# Patient Record
Sex: Female | Born: 1985 | Race: Black or African American | Hispanic: No | Marital: Single | Smoking: Never smoker
Health system: Southern US, Community
[De-identification: ages and names within clinical notes are randomized; demographics above are authoritative.]

## PROBLEM LIST (undated history)

## (undated) HISTORY — PX: DILATION AND CURETTAGE OF UTERUS: SHX78

---

## 2004-07-17 ENCOUNTER — Ambulatory Visit: Payer: Self-pay | Admitting: Family Medicine

## 2004-08-20 ENCOUNTER — Ambulatory Visit: Payer: Self-pay | Admitting: Family Medicine

## 2005-11-17 IMAGING — US US PELV - US TRANSVAGINAL
1 series · 17 of 25 positions shown · non-contrast
Comparison: none

REASON FOR EXAM: Pelvic pain
COMMENTS:

[Series 1: us pelv - us transvaginal · 17 of 38 slices shown]
[im 1/38]
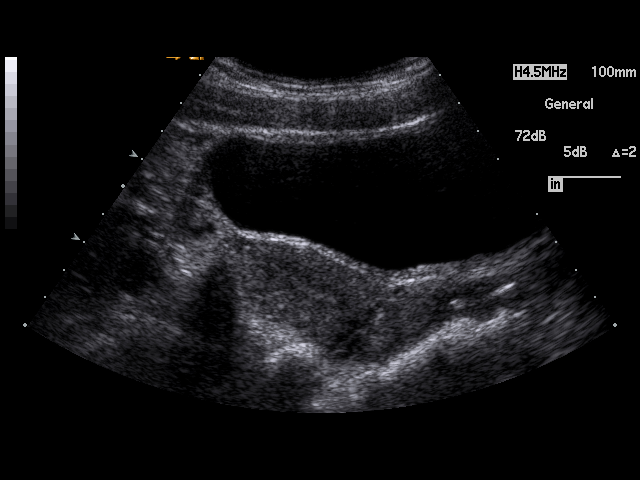
[im 4/38]
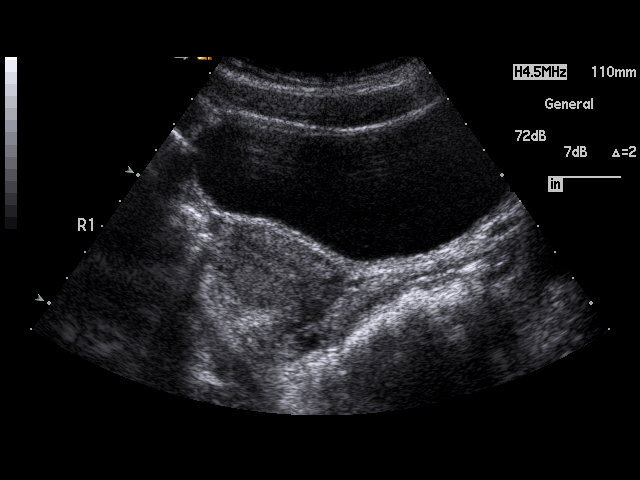
[im 5/38]
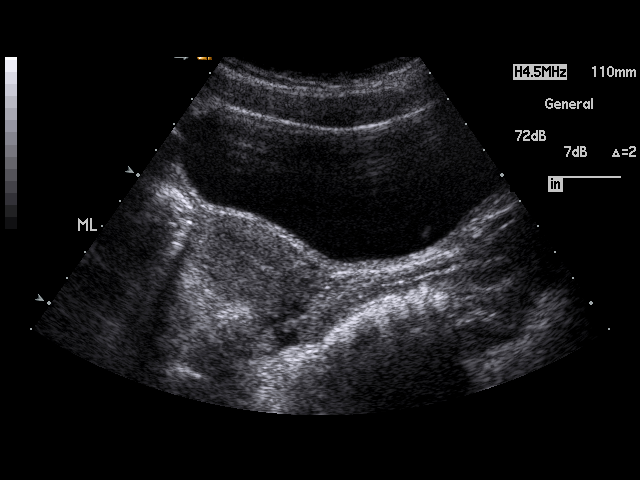
[im 8/38]
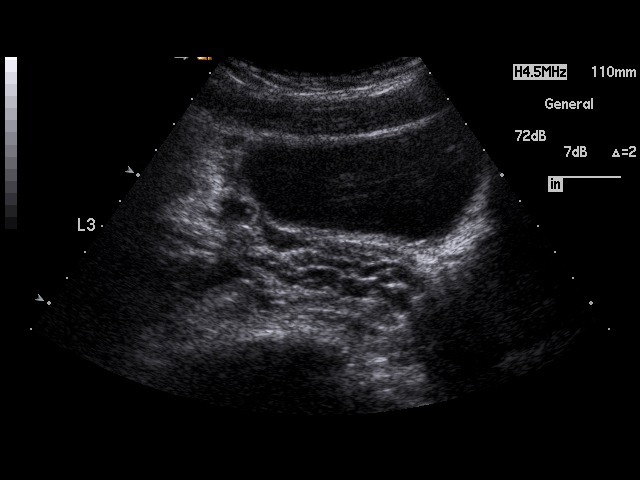
[im 10/38]
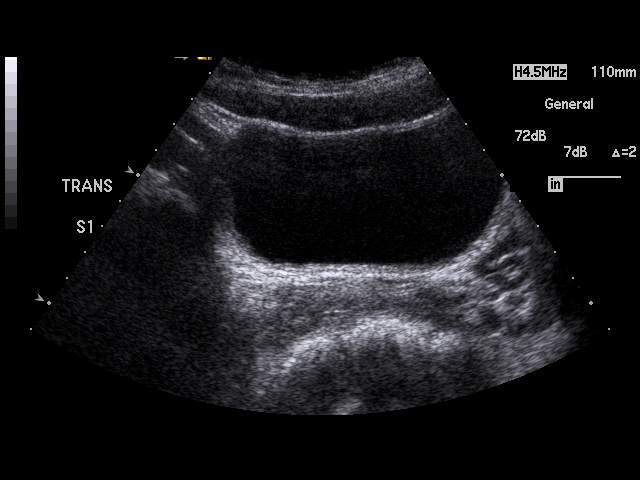
[im 13/38]
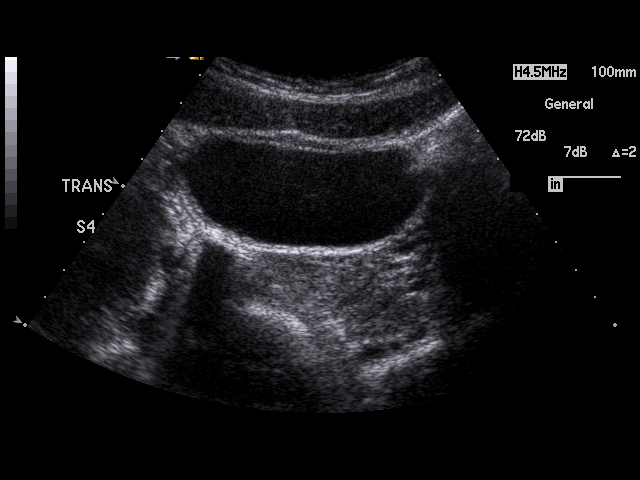
[im 14/38]
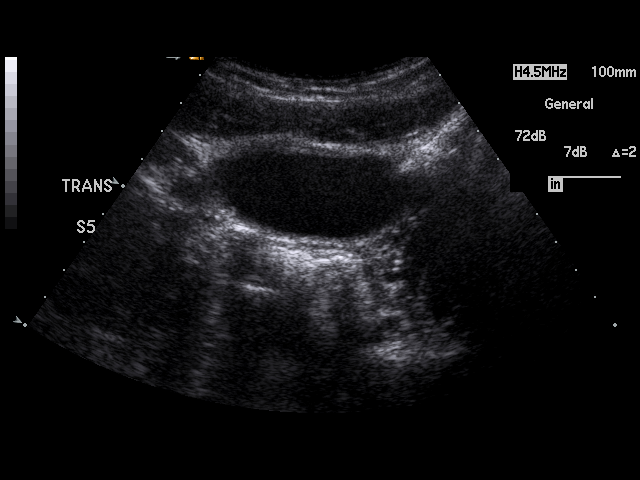
[im 17/38]
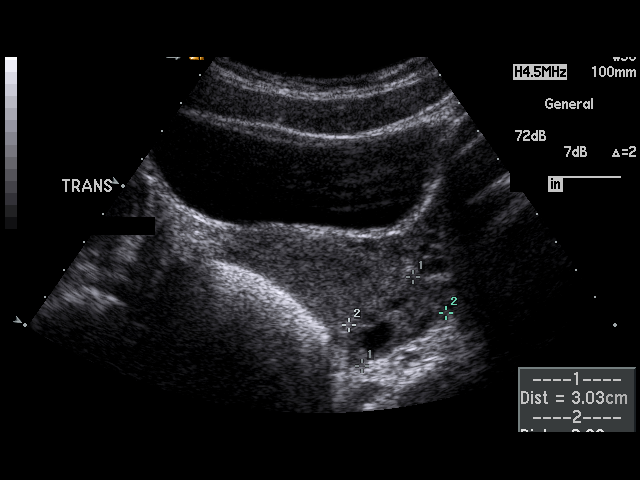
[im 19/38]
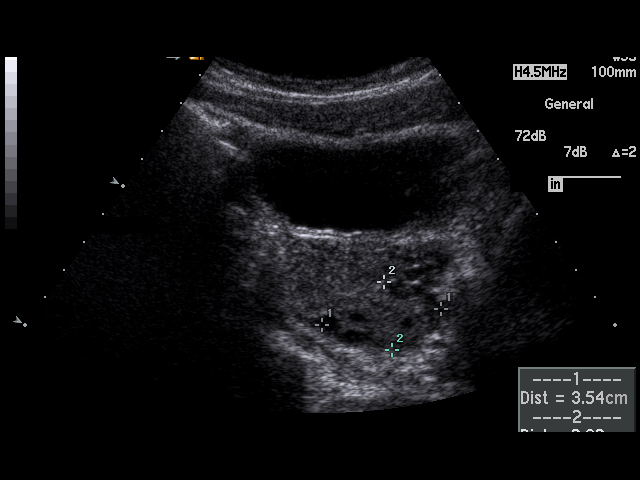
[im 21/38]
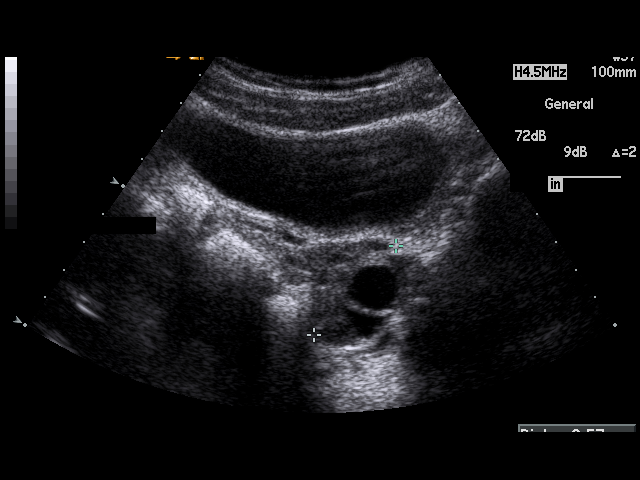
[im 24/38]
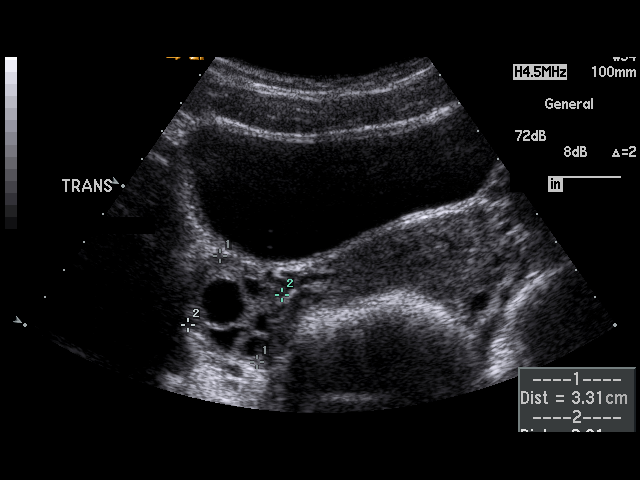
[im 25/38]
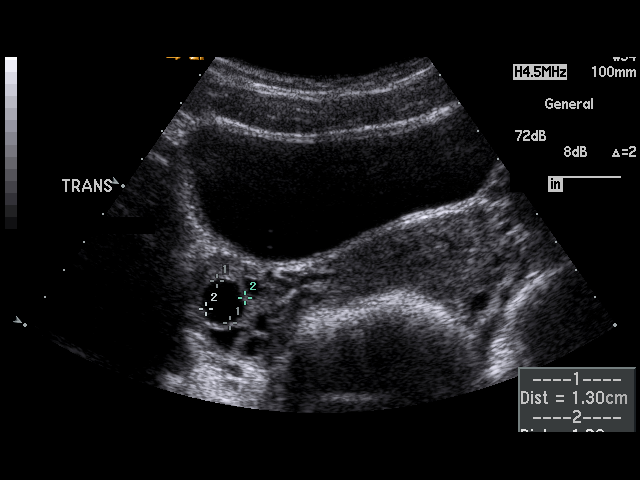
[im 28/38]
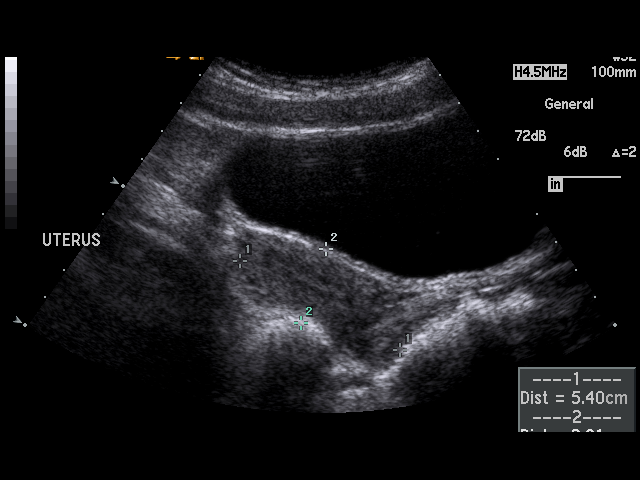
[im 30/38]
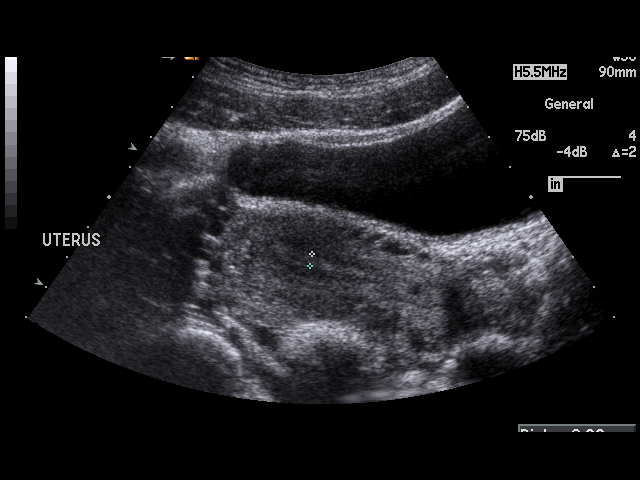
[im 33/38]
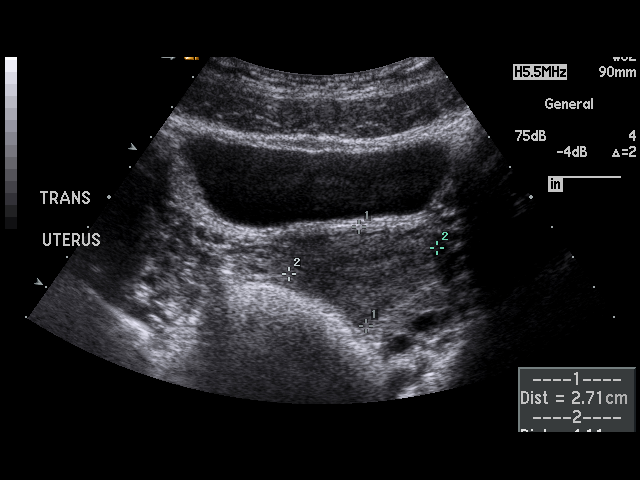
[im 34/38]
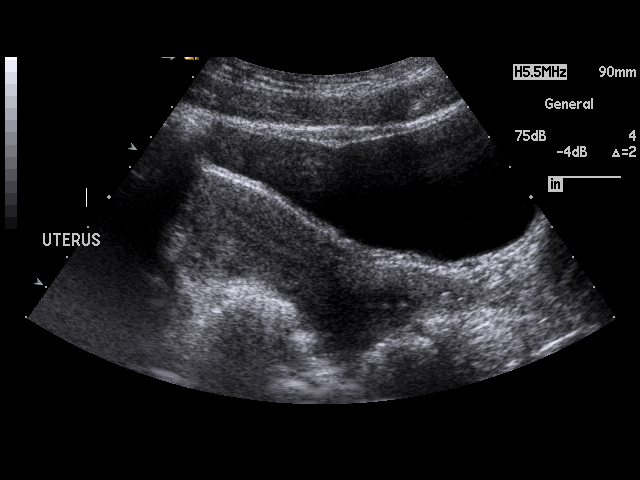
[im 38/38]
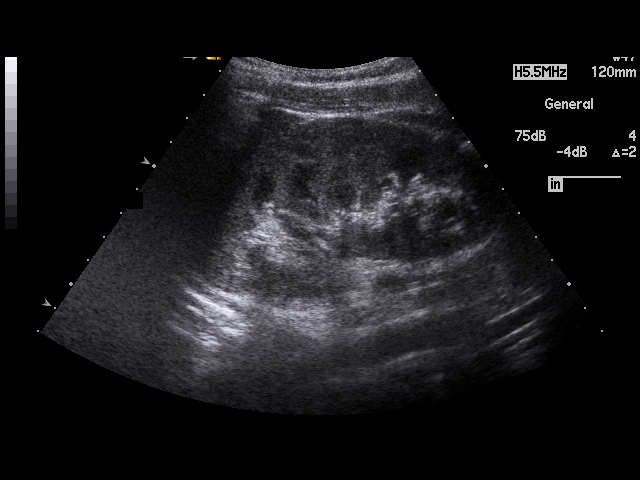

[17 of 25 positions shown; findings below may reference images not displayed]

PROCEDURE:     US  - US PELVIS MASS EXAM  - [DATE] [DATE] [DATE]  [DATE]

RESULT:     The patient is complaining of pelvic discomfort.  The uterus
exhibits normal echotexture with no evidence of abnormal endometrial
thickening.  The uterus measures 5.4 x 2.7 x 4.1 cm.  The endometrial stripe
measures 3.3 mm.  No abnormal endometrial fluid collections are seen.  I see
no free fluid in the cul-de-sac.  The RIGHT ovary measures 3.6 x 3.3 x
cm and contains an approximately 1.4 cm diameter simple-appearing cyst as
well as developing follicles.  The LEFT ovary exhibits a few developing
follicles and measures 3.5 x 2.0 x 2.9 cm.  I see no free fluid in the
cul-de-sac.
IMPRESSION: The appearance of the uterus and LEFT ovary is normal.  The RIGHT ovary
demonstrates an approximately 1.4 cm diameter simple-appearing cyst.

## 2005-12-21 IMAGING — US US PELV - US TRANSVAGINAL
1 series · 17 of 25 positions shown · non-contrast
Comparison: none

REASON FOR EXAM: F/U ovarian cyst
COMMENTS:

[Series 1: us pelv - us transvaginal · 17 of 51 slices shown]
[im 1/51]
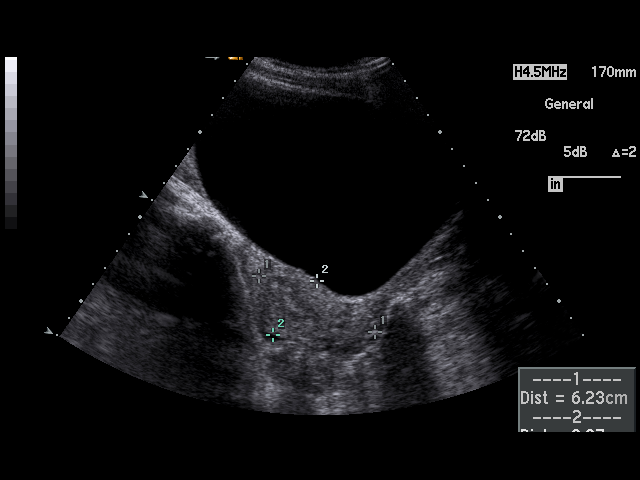
[im 5/51]
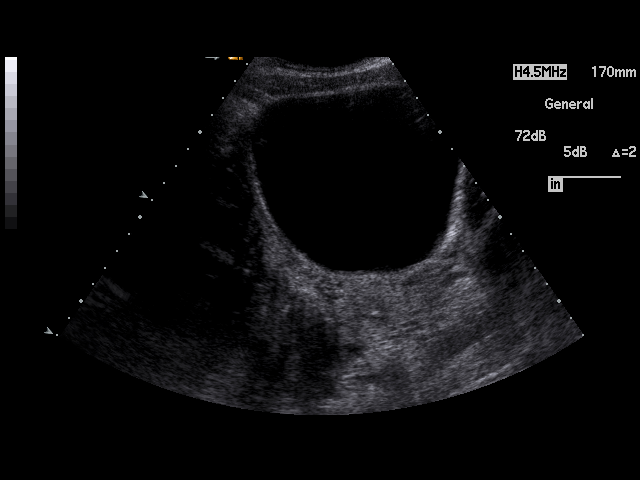
[im 7/51]
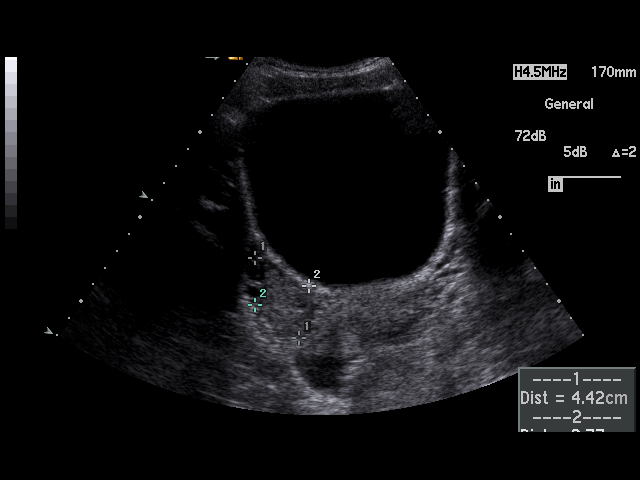
[im 11/51]
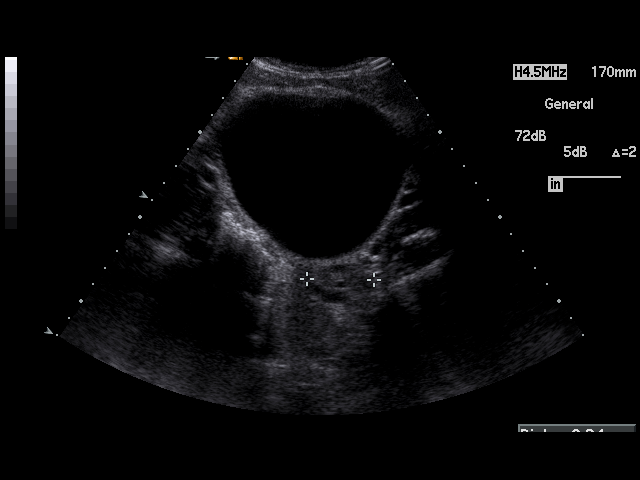
[im 13/51]
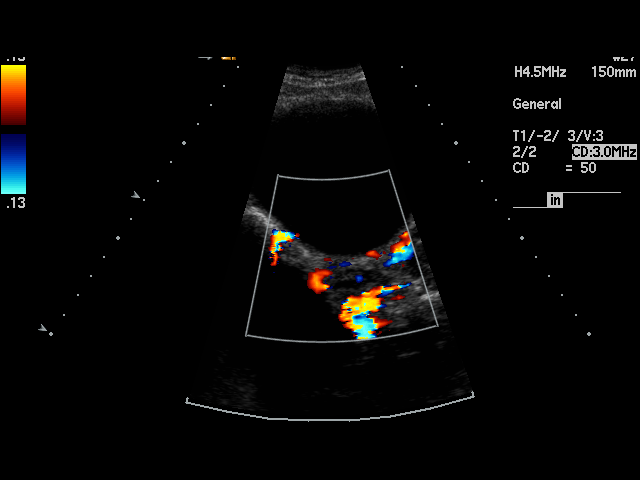
[im 17/51]
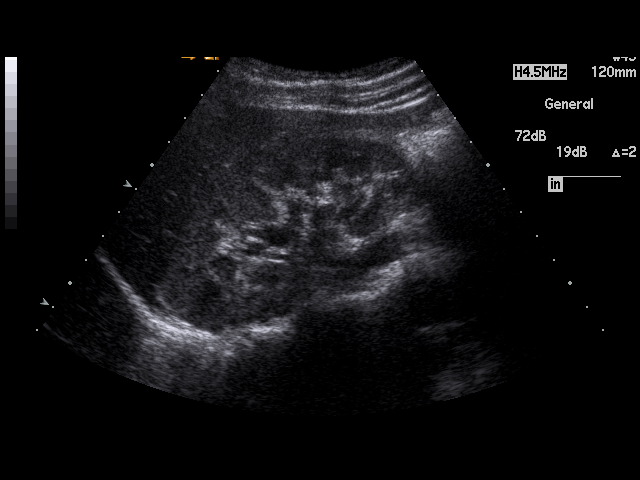
[im 19/51]
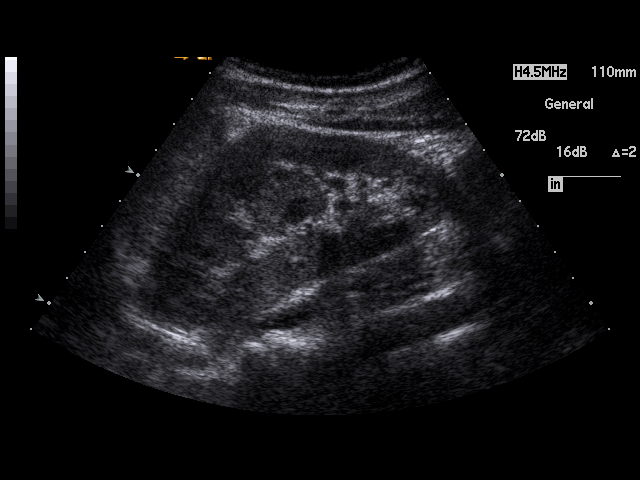
[im 23/51]
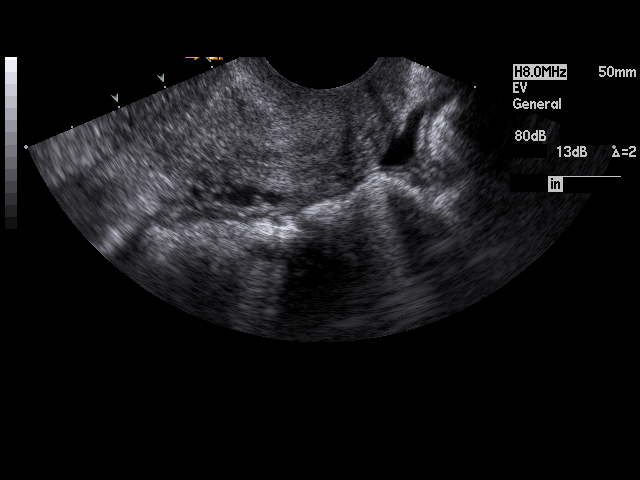
[im 26/51]
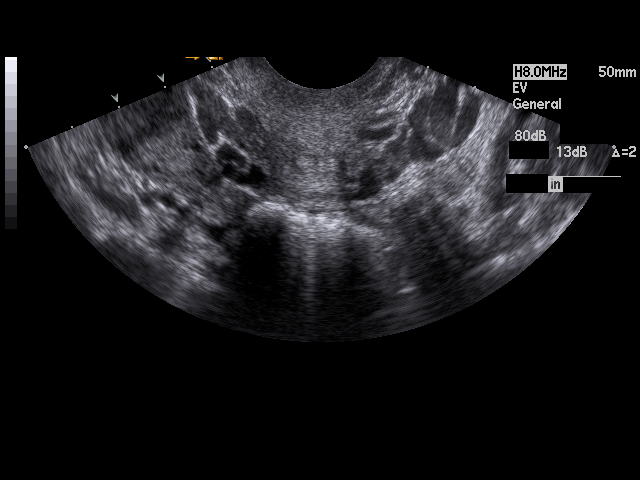
[im 28/51]
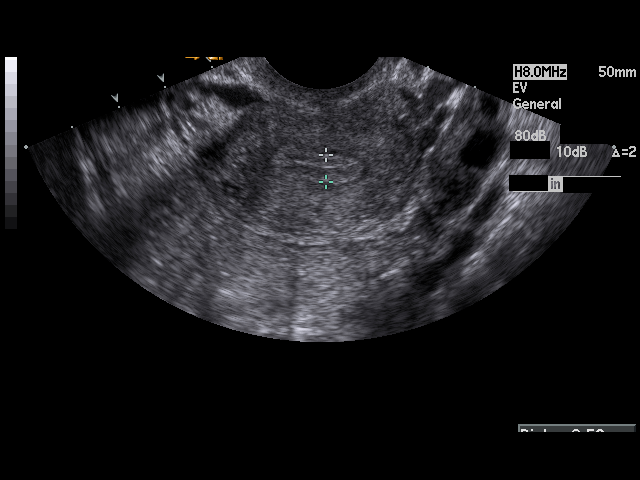
[im 32/51]
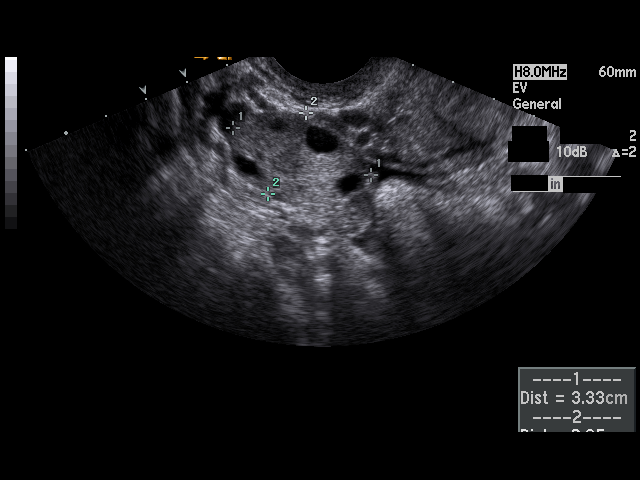
[im 34/51]
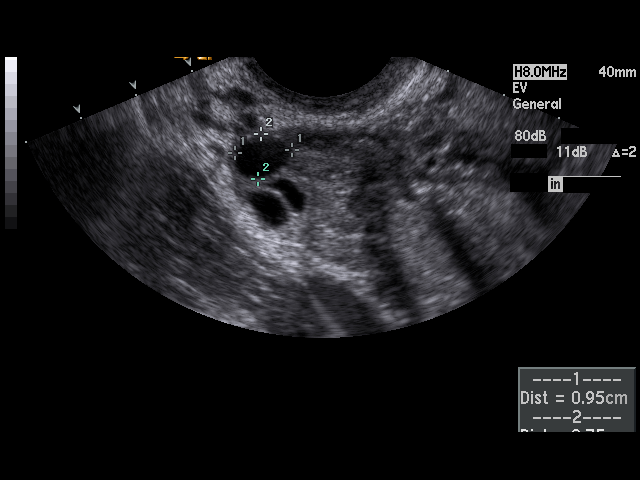
[im 38/51]
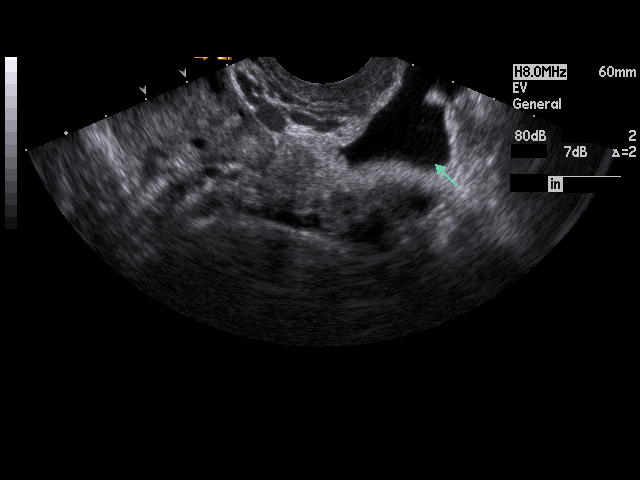
[im 40/51]
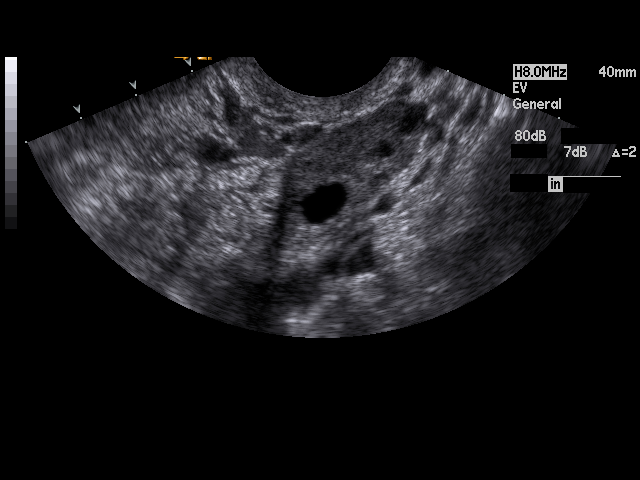
[im 44/51]
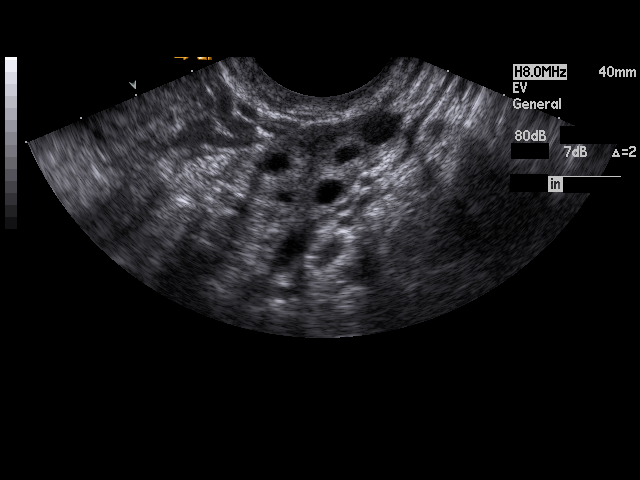
[im 46/51]
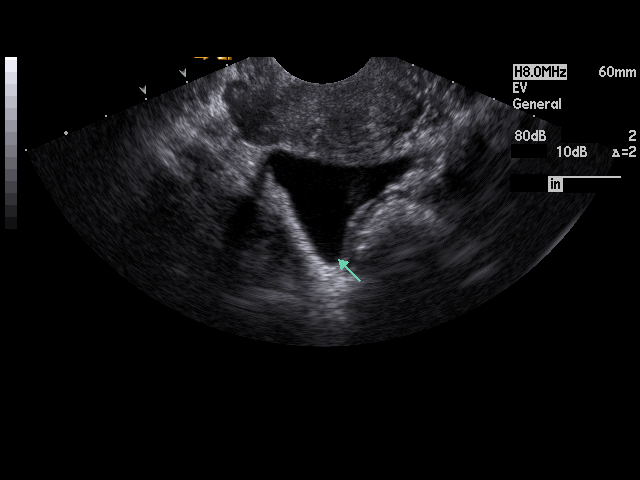
[im 51/51]
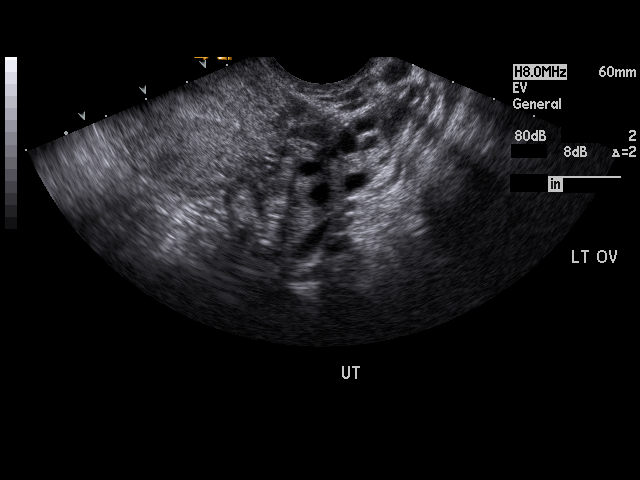

[17 of 25 positions shown; findings below may reference images not displayed]

PROCEDURE:     US  - US PELVIS MASS EXAM  - [DATE] [DATE] [DATE]  [DATE]

RESULT:     The uterus measures 6.2 cm x 3.4 cm x 4.2 cm.  No uterine mass
lesions are seen.  The endometrium measures 4.1 mm in thickness.  The RIGHT
and LEFT ovaries are visualized.  The RIGHT ovary as measured on the
transvaginal component of the exam measures 3.54 cm at maximum diameter and
the LEFT ovary measures 2.85 cm at maximum diameter.  Follicular cysts are
seen in each ovary.  No abnormal adnexal masses are seen.  There is a
nonspecific small amount of free fluid observed in the pelvis.  The
visualized portion of the urinary bladder is normal in appearance.  The
kidneys show no hydronephrosis.
IMPRESSION: Follicular cysts are seen in each ovary.

No uterine mass lesions are identified.

There is a nonspecific small amount of free fluid observed in the pelvis.

The endometrium measures 4.1 mm in thickness.

## 2008-04-21 ENCOUNTER — Observation Stay: Payer: Self-pay | Admitting: Obstetrics and Gynecology

## 2008-04-22 ENCOUNTER — Observation Stay: Payer: Self-pay

## 2010-01-01 ENCOUNTER — Ambulatory Visit: Payer: Self-pay | Admitting: Internal Medicine

## 2010-01-07 ENCOUNTER — Encounter: Payer: Self-pay | Admitting: Internal Medicine

## 2010-01-10 ENCOUNTER — Telehealth: Payer: Self-pay | Admitting: Internal Medicine

## 2010-04-08 NOTE — Letter (Signed)
Summary: Results Follow up Letter  [facility]  [address] [place], [place] [address]   Phone: [phone]  Fax: [phone]    01/07/2010 MRN: [id number]  Diane Suarez [address] [place], [place]  [address]  Dear [name],  The following are the results of your recent test(s):  Test         Result    Pap Smear:        Normal _X____  Not Normal _____ Comments:Pap smear is normal ______________________________________________________ Cholesterol: LDL(Bad cholesterol):         Your goal is less than:         HDL (Good cholesterol):       Your goal is more than: Comments:  ______________________________________________________ Mammogram:        Normal _____  Not Normal _____ Comments:  ___________________________________________________________________ Hemoccult:        Normal _____  Not normal _______ Comments:    _____________________________________________________________________ Other Tests:    We routinely do not discuss normal results over the telephone.  If you desire a copy of the results, or you have any questions about this information we can discuss them at your next office visit.   Sincerely,      Tillman Abide, MD

## 2010-04-08 NOTE — Letter (Signed)
Summary: Records Dated 04-13-08 thru 03-15-09/[facility]  Records Dated 04-13-08 thru 03-15-09/[facility]   Imported By: Lanelle Bal 01/28/2010 15:38:17  _____________________________________________________________________  External Attachment:    Type:   Image     Comment:   External Document

## 2010-04-08 NOTE — Assessment & Plan Note (Signed)
Summary: NEW PT TO EST/CLE   Vital Signs:  Patient profile:   25 year old female Height:      64.5 inches Weight:      108 pounds BMI:     18.32 Temp:     98.3 degrees F oral Pulse rate:   88 / minute Pulse rhythm:   regular BP sitting:   118 / 60  (left arm) Cuff size:   regular  Vitals Entered By: Mervin Hack CMA Duncan Dull) (January 01, 2010 11:50 AM) CC: new patient to establish care   History of Present Illness: Reestablishing here I saw her  years ago Moved to [place] and now is back Just needs physical  Has implant good till 2013 Gets period for 2 weeks every month    Preventive Screening-Counseling & Management  Alcohol-Tobacco     Smoking Status: never  Allergies (verified): No Known Drug Allergies  Past History:  Past Medical History: Unremarkable  Past Surgical History: D&C for TOP   Family History: Mom is healthy Doesn't know dad but HTN on his side HTN in sister DM in maternal GM No breast or colon cancer  Social History: 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 Eyes:  Denies double vision and vision loss-1 eye. ENT:  Denies decreased hearing and ringing in ears; teeth okay--overdue for dentist. CV:  Denies chest pain or discomfort, difficulty breathing at night, difficulty breathing while lying down, fainting, lightheadness, palpitations, and shortness of breath with exertion. Resp:  Denies cough and shortness of breath. GI:  Complains of constipation; denies abdominal pain, bloody stools, change in bowel habits, dark tarry stools, indigestion, nausea, and vomiting; chronic mild constipation. GU:  Denies dysuria and incontinence; discussed safe sex. MS:  Denies joint pain  and joint swelling. Derm:  Denies lesion(s) and rash; did have spot on face that has gone away. Neuro:  Denies headaches, numbness, tingling, and weakness. Psych:  Denies anxiety and depression. Endo:  Denies cold intolerance and heat intolerance. Heme:  Denies abnormal bruising and enlarge lymph nodes. Allergy:  Denies seasonal allergies and sneezing.  Physical Exam  General:  alert and normal appearance.   Eyes:  pupils equal, pupils round, pupils reactive to light, and no optic disk abnormalities.   Ears:  R ear normal and L ear normal.   Mouth:  no erythema, no exudates, and no lesions.   Neck:  supple, no masses, no thyromegaly, no carotid bruits, and no cervical lymphadenopathy.   Breasts:  no abnormal thickening, no tenderness, and no adenopathy.  Mild bilat cystic changes Lungs:  normal respiratory effort, no intercostal retractions, no accessory muscle use, and normal breath sounds.   Heart:  normal rate, regular rhythm, no murmur, and no gallop.   Abdomen:  soft, non-tender, and no masses.   Genitalia:  no external lesions, no vaginal or cervical lesions, no friaility or hemorrhage, normal uterus size and position, and no adnexal masses or tenderness.   Msk:  no joint tenderness and no joint swelling.   Pulses:  1+ in feet Extremities:  no edema Neurologic:  alert & oriented X3, strength normal in all extremities, and gait normal.   Skin:  no rashes and no suspicious lesions.   Psych:  normally interactive, good eye contact, not anxious appearing,  and not depressed appearing.     Impression & Recommendations:  Problem # 1:  PREVENTIVE HEALTH CARE (ICD-V70.0) Assessment Comment Only healthy would like to gain weight--discussed fitness program Light weights could increase her appetite Tdap  Menorrhagia with insert. May need to have remove. Expect same with depoprovera. Can't remember OCP daily. She needs condoms anyway to protect against STDs, suggested she use these for  birth control with spermicidal foam if she has insert taken out  Complete Medication List: 1)  Implanon 68 Mg Impl (Etonogestrel) .... Inserted 2010 exp 2013  Other Orders: Tdap => 10yrs IM (04540) Admin 1st Vaccine (98119) Flu Vaccine 9yrs + (14782) Admin of Any Addtl Vaccine (95621)  Patient Instructions: 1)  Please schedule a follow-up appointment in 1 year.    Orders Added: 1)  New Patient 18-39 years [99385] 2)  Tdap => 75yrs IM [90715] 3)  Admin 1st Vaccine [90471] 4)  Flu Vaccine 13yrs + [30865] 5)  Admin of Any Addtl Vaccine [78469]   Immunizations Administered:  Tetanus Vaccine:    Vaccine Type: Tdap    Site: right deltoid    Mfr: GlaxoSmithKline    Dose: 0.5 ml    Route: IM    Given by: Mervin Hack CMA (AAMA)    Exp. Date: 12/27/2011    Lot #: [id number]    VIS given: 01/25/08 version given January 01, 2010.  Influenza Vaccine # 1:    Vaccine Type: Fluvax 3+    Site: left deltoid    Mfr: GlaxoSmithKline    Dose: 0.5 ml    Route: IM    Given by: Mervin Hack CMA (AAMA)    Exp. Date: 09/06/2010    Lot #: [id number]    VIS given: 10/01/09 version given January 01, 2010.  Flu Vaccine Consent Questions:    Do you have a history of severe allergic reactions to this vaccine? no    Any prior history of allergic reactions to egg and/or gelatin? no    Do you have a sensitivity to the preservative Thimersol? no    Do you have a past history of Guillan-Barre Syndrome? no    Do you currently have an acute febrile illness? no    Have you ever had a severe reaction to latex? no    Vaccine information given and explained to patient? yes    Are you currently pregnant? no   Immunizations Administered:  Tetanus Vaccine:    Vaccine Type: Tdap    Site: right deltoid    Mfr: GlaxoSmithKline    Dose: 0.5 ml    Route: IM    Given by: Mervin Hack CMA (AAMA)    Exp. Date: 12/27/2011    Lot #: [id number]    VIS given: 01/25/08 version given January 01, 2010.  Influenza Vaccine # 1:    Vaccine Type: Fluvax 3+    Site: left deltoid    Mfr: GlaxoSmithKline    Dose: 0.5 ml    Route: IM    Given by: Mervin Hack CMA (AAMA)    Exp. Date: 09/06/2010    Lot #: [id number]    VIS given: 10/01/09 version given January 01, 2010.  Prior Medications: IMPLANON 68 MG IMPL (ETONOGESTREL) inserted 2010 exp 2013 Current Allergies (reviewed today): No known allergies

## 2010-04-08 NOTE — Progress Notes (Signed)
Summary: Pain  Phone Note Call from Patient Call back at Home Phone [phone]   Caller: Patient Call For: Cindee Salt MD Summary of Call: pt calling complaining of pain right near her tailbone? pt states she was sitting and playing with her daughter and when she got up, and hard, sharp pain ran thru her tailbone, pt states it's still hurting but not as bad. Please advise. Initial call taken by: Mervin Hack CMA Duncan Dull),  January 10, 2010 1:11 PM  Follow-up for Phone Call        it is not unusual to get pain over the tailbone There is not much to evaluate for unless she is having weakness or sig pain into her legs---or change with bowel or bladder function  she can try OTC pain relievers and get checked next week if not better can try sitting on padding or a pillow--this may help some also Follow-up by: Cindee Salt MD,  January 10, 2010 1:18 PM  Additional Follow-up for Phone Call Additional follow up Details #1::        Spoke with patient and advised results.  Additional Follow-up by: Mervin Hack CMA (AAMA),  January 10, 2010 1:22 PM

## 2010-05-13 ENCOUNTER — Telehealth: Payer: Self-pay | Admitting: Internal Medicine

## 2010-05-20 NOTE — Progress Notes (Signed)
Summary: regarding mirena  Phone Note Call from Patient Call back at Home Phone [phone]   Caller: Patient Call For: Cindee Salt MD Summary of Call: Pt is asking about getting mirena, she says she has discussed this with you.  She is asking if she needs to be on her period when she gets this.  Please advise.  I advised her that you are out of the office this week, this is not urgent. Initial call taken by: Lowella Petties CMA, AAMA,  May 13, 2010 9:51 AM  Follow-up for Phone Call        Reviewed pt chart.  Appears she has an implanon and has had some issues with it.  I would prefer for her to see GYN to discuss options since it appears she has had menorrhagia with implanon. she may be a better candidate for paraguard (IUD that does not release hormones) which we do not have here.   Additional Follow-up for Phone Call Additional follow up Details #1::        Advised pt.  She said she would check with her gyn in [place].             Lowella Petties CMA, AAMA  May 14, 2010 11:37 AM

## 2010-10-16 ENCOUNTER — Encounter: Payer: Self-pay | Admitting: Internal Medicine

## 2010-10-17 ENCOUNTER — Ambulatory Visit (INDEPENDENT_AMBULATORY_CARE_PROVIDER_SITE_OTHER): Payer: 59 | Admitting: Internal Medicine

## 2010-10-17 ENCOUNTER — Encounter: Payer: Self-pay | Admitting: Internal Medicine

## 2010-10-17 VITALS — BP 104/76 | HR 84 | Temp 98.1°F | Ht 64.5 in | Wt 112.0 lb

## 2010-10-17 DIAGNOSIS — Z202 Contact with and (suspected) exposure to infections with a predominantly sexual mode of transmission: Secondary | ICD-10-CM | POA: Insufficient documentation

## 2010-10-17 NOTE — Assessment & Plan Note (Signed)
No oral lesions Low risk for vaginal infections since she has had protected sex Will plan to screen for GC and chlamydia with yearly pap Will check HIV and RPR since not done in some time  She believes she has had herpes infection in the past---explained that blood tests would show this but no reason to repeat counselled all of 15 minute visit

## 2010-10-17 NOTE — Progress Notes (Signed)
  Subjective:    Patient ID: Diane Suarez, female    DOB: May 08, 1985, 25 y.o.   MRN: [id number]  HPI Has single partner Just started sexual relationship Did use condom for vaginal sex but not oral  No oral lesions or symptoms No history of cold sores  No vaginal symptoms occ gets slight discharge before her cycles---no real change   Review of Systems     Objective:   Physical Exam        Assessment & Plan:

## 2010-10-18 LAB — RPR

## 2010-10-27 ENCOUNTER — Encounter: Payer: Self-pay | Admitting: Internal Medicine

## 2010-10-27 ENCOUNTER — Ambulatory Visit (INDEPENDENT_AMBULATORY_CARE_PROVIDER_SITE_OTHER): Payer: 59 | Admitting: Internal Medicine

## 2010-10-27 VITALS — BP 100/64 | HR 72 | Temp 98.7°F | Ht 64.5 in | Wt 114.2 lb

## 2010-10-27 DIAGNOSIS — IMO0002 Reserved for concepts with insufficient information to code with codable children: Secondary | ICD-10-CM

## 2010-10-27 MED ORDER — CEPHALEXIN 500 MG PO TABS: 500.0000 mg | ORAL_TABLET | Freq: Three times a day (TID) | ORAL | Status: AC

## 2010-10-27 NOTE — Patient Instructions (Signed)
Please call if the nails don't get better for referral to dermatologist

## 2010-10-27 NOTE — Progress Notes (Signed)
  Subjective:    Patient ID: Diane Suarez, female    DOB: 1985-06-11, 25 y.o.   MRN: [id number]  HPI Noted some changes in left thumbnail--coming up from the nailbed Started a couple of weeks ago Now spread to the index finger and ring fingers Pain in index and ring fingers  Doesn't use polish and hasn't had any treatments on them  Current Outpatient Prescriptions on File Prior to Visit  Medication Sig Dispense Refill  . levonorgestrel (MIRENA) 20 MCG/24HR IUD 1 each by Intrauterine route once.          No Known Allergies  No past medical history on file.  Past Surgical History  Procedure Date  . Dilation and curettage of uterus     for TOP    Family History  Problem Relation Age of Onset  . Hypertension Sister   . Diabetes Maternal Grandmother     History   Social History  . Marital Status: Single    Spouse Name: N/A    Number of Children: 1  . 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 Smoking status: Never Smoker   . Smokeless tobacco: Never Used  . Alcohol Use: Yes     occassionally  . Drug Use: No  . Sexually Active: Not on file   Other Topics Concern  . Not on file   Social History Narrative  . No narrative on file   Review of Systems Feels well No fevers    Objective:   Physical Exam  Constitutional: She appears well-developed and well-nourished. No distress.  Skin:       3 abnormal nails with about 1/3rd of nails abnormal with slightly raised appearance and redness/tenderness at transition zone on left 4th finger No evidence of apparent mycotic infection          Assessment & Plan:

## 2010-10-27 NOTE — Assessment & Plan Note (Signed)
Unusual appearance  May be bacterial infection with the acute inflammation but not typical paronychia (and several nails involved which are not inflamed) Will treat with cephalexin Dermatologist if persists

## 2011-02-19 ENCOUNTER — Telehealth: Payer: Self-pay | Admitting: Internal Medicine

## 2011-02-19 NOTE — Telephone Encounter (Signed)
Patient states she thinks she is having allergic reaction to something she ate but she put Abreva and it helped it decrease a little. She states that she has an appt. On the 24th and will wait until then.  She states Sunday pus was oozing out but now just once and a while.

## 2011-02-19 NOTE — Telephone Encounter (Signed)
If she has an oozing blister on or in her mouth, it could be herpes (a cold sore). They usually get better on their own. She should call for earlier appt if it worsens

## 2011-02-19 NOTE — Telephone Encounter (Signed)
Spoke with patient and advised results, she will wait until the 24th for her CPX appointment. She will call if anything changes.

## 2011-03-02 ENCOUNTER — Encounter: Payer: Self-pay | Admitting: Internal Medicine

## 2011-03-02 ENCOUNTER — Ambulatory Visit (INDEPENDENT_AMBULATORY_CARE_PROVIDER_SITE_OTHER): Payer: 59 | Admitting: Internal Medicine

## 2011-03-02 VITALS — BP 108/60 | HR 94 | Temp 98.0°F | Ht 64.0 in | Wt 111.0 lb

## 2011-03-02 DIAGNOSIS — B001 Herpesviral vesicular dermatitis: Secondary | ICD-10-CM

## 2011-03-02 DIAGNOSIS — B009 Herpesviral infection, unspecified: Secondary | ICD-10-CM

## 2011-03-02 DIAGNOSIS — Z0000 Encounter for general adult medical examination without abnormal findings: Secondary | ICD-10-CM

## 2011-03-02 NOTE — Assessment & Plan Note (Signed)
Healthy Counseling done---suggested regular exercise, continued safe sex practices  Pap and GC/chlamydia done

## 2011-03-02 NOTE — Progress Notes (Signed)
Subjective:    Patient ID: Diane Suarez, female    DOB: 11-05-1985, 25 y.o.   MRN: [id number]  HPI Doing generally okay Did have swelling and showed pictures that clearly look like vesicular outbreak Now is improving  May have pulled something along right sternal border Goes back 2 weeks No cough or SOB No meds for this  Relationship is over with man Discussed safe sex  No current outpatient prescriptions on file prior to visit.    No Known Allergies  No past medical history on file.  Past Surgical History  Procedure Date  . Dilation and curettage of uterus     for TOP    Family History  Problem Relation Age of Onset  . Hypertension Sister   . Diabetes Maternal Grandmother     History   Social History  . Marital Status: Single    Spouse Name: N/A    Number of Children: 1  . 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 Smoking status: Never Smoker   . Smokeless tobacco: Never Used  . Alcohol Use: Yes     occassionally  . Drug Use: No  . Sexually Active: Yes    Birth Control/ Protection: IUD   Other Topics Concern  . Not on file   Social History Narrative  . No narrative on file   Review of Systems  Constitutional: Negative for fatigue and unexpected weight change.       Wears seat belt No set exercise Uses multivitamin  HENT: Positive for congestion and rhinorrhea. Negative for hearing loss, dental problem and tinnitus.        Uses OTC allergy meds prn Keeps up with dentist  Eyes: Negative for visual disturbance.       No diplopia or unilateral vision loss  Respiratory: Negative for cough, chest tightness and shortness of breath.   Cardiovascular: Negative for chest pain, palpitations and leg swelling.  Gastrointestinal: Positive for abdominal pain. Negative for nausea, vomiting, constipation, blood in stool and anal bleeding.       Occ cramps before menstrual  cycle---discussed that she could try advil for this----she usually just lies down No heartburn  Genitourinary: Negative for dysuria, hematuria, difficulty urinating and dyspareunia.       Still has Mirena in Periods remain fairly normal  Musculoskeletal: Negative for back pain, joint swelling and arthralgias.  Skin: Negative for color change and rash.       No suspicious lesions  Neurological: Negative for dizziness, syncope, weakness, light-headedness, numbness and headaches.       Occ headaches---advil does give relief  Hematological: Negative for adenopathy. Does not bruise/bleed easily.  Psychiatric/Behavioral: Positive for sleep disturbance. Negative for hallucinations. The patient is not nervous/anxious.        Occ sleep problems---doesn't use meds       Objective:   Physical Exam  Constitutional: She is oriented to person, place, and time. She appears well-developed and well-nourished. No distress.  HENT:  Head: Normocephalic and atraumatic.  Right Ear: External ear normal.  Left Ear: External ear normal.  Mouth/Throat: Oropharynx is clear and moist. No oropharyngeal exudate.       TMs normal  Eyes: Conjunctivae and EOM are normal. Pupils are equal, round, and reactive to light.       Fundi benign  Neck: Normal range of motion. Neck supple. No thyromegaly present.  Cardiovascular: Normal rate, regular rhythm,  normal heart sounds and intact distal pulses.  Exam reveals no gallop.   No murmur heard. Pulmonary/Chest: Effort normal and breath sounds normal. No respiratory distress. She has no wheezes. She has no rales.  Abdominal: Soft. She exhibits no mass. There is no tenderness.  Genitourinary:       Mild cystic changes in breasts  Normal introitus Cervix appears normal Pap and GC/chlamydia probe done Bimanual negative  Musculoskeletal: Normal range of motion. She exhibits no edema and no tenderness.  Lymphadenopathy:    She has no cervical adenopathy.  Neurological:  She is alert and oriented to person, place, and time.  Skin: Skin is warm. No rash noted. No erythema.  Psychiatric: She has a normal mood and affect. Her behavior is normal. Judgment and thought content normal.          Assessment & Plan:

## 2011-03-03 LAB — LIPID PANEL
Chol/HDL Ratio: 1.8 ratio units (ref 0.0–4.4)
LDL Calculated: 60 mg/dL (ref 0–99)

## 2011-03-04 DIAGNOSIS — B001 Herpesviral vesicular dermatitis: Secondary | ICD-10-CM | POA: Insufficient documentation

## 2011-03-04 LAB — GLUCOSE, RANDOM

## 2011-03-04 LAB — GC/CHLAMYDIA PROBE AMP

## 2011-03-04 LAB — GLUCOSE, FASTING: Glucose: 86 mg/dL (ref 65–99)

## 2011-03-04 MED ORDER — VALACYCLOVIR HCL 1 G PO TABS: 2000.0000 mg | ORAL_TABLET | Freq: Two times a day (BID) | ORAL | Status: DC

## 2011-03-04 NOTE — Progress Notes (Signed)
Addended by: Tillman Abide I on: 03/04/2011 07:54 AM   Modules accepted: Orders

## 2011-03-04 NOTE — Assessment & Plan Note (Signed)
Will prescribe valacyclovir for abortive Rx

## 2011-03-06 LAB — PAP LB (LIQUID-BASED)

## 2011-07-01 ENCOUNTER — Encounter: Payer: Self-pay | Admitting: Internal Medicine

## 2011-07-01 ENCOUNTER — Ambulatory Visit (INDEPENDENT_AMBULATORY_CARE_PROVIDER_SITE_OTHER): Payer: 59 | Admitting: Internal Medicine

## 2011-07-01 VITALS — BP 120/80 | HR 120 | Temp 98.2°F | Wt 111.0 lb

## 2011-07-01 DIAGNOSIS — Z202 Contact with and (suspected) exposure to infections with a predominantly sexual mode of transmission: Secondary | ICD-10-CM

## 2011-07-01 DIAGNOSIS — N912 Amenorrhea, unspecified: Secondary | ICD-10-CM

## 2011-07-01 LAB — POCT URINE PREGNANCY: Preg Test, Ur: NEGATIVE

## 2011-07-01 NOTE — Progress Notes (Signed)
  Subjective:    Patient ID: Diane Suarez, female    DOB: 05/01/1985, 26 y.o.   MRN: [id number]  HPI Back with her boyfriend Has mirena in  Using condoms with him Condom broke during intercourse 1 week ago Noted slight vaginal discharge 4 days ago No pain No genital sores, etc Did have small tear after intercourse  She is not confident that he is monogamous  Still with some periods but now lightening up and not as heavy  Current Outpatient Prescriptions on File Prior to Visit  Medication Sig Dispense Refill  . valACYclovir (VALTREX) 1000 MG tablet Take 2 tablets (2,000 mg total) by mouth 2 (two) times daily.  20 tablet  2    No Known Allergies  No past medical history on file.  Past Surgical History  Procedure Date  . Dilation and curettage of uterus     for TOP    Family History  Problem Relation Age of Onset  . Hypertension Sister   . Diabetes Maternal Grandmother     History   Social History  . Marital Status: Single    Spouse Name: N/A    Number of Children: 1  . 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 Smoking status: Never Smoker   . Smokeless tobacco: Never Used  . Alcohol Use: Yes     occassionally  . Drug Use: No  . Sexually Active: Yes    Birth Control/ Protection: IUD   Other Topics Concern  . Not on file   Social History Narrative  . No narrative on file   Review of Systems Feels well Is worried about STD     Objective:   Physical Exam  Genitourinary:       No external lesions Cervix appears normal---GC/chlamydia swab done No cervical or adnexal tenderness          Assessment & Plan:

## 2011-07-01 NOTE — Assessment & Plan Note (Signed)
More like change in cycles with the mirena Condom failure but fortunately not pregnant

## 2011-07-01 NOTE — Assessment & Plan Note (Signed)
GC/chlamydia done Would consider other testing if symptoms

## 2011-07-03 LAB — GC/CHLAMYDIA PROBE AMP
Chlamydia trachomatis, NAA: NEGATIVE
Neisseria gonorrhoeae by PCR: NEGATIVE

## 2011-07-13 ENCOUNTER — Telehealth: Payer: Self-pay | Admitting: Internal Medicine

## 2011-07-13 NOTE — Telephone Encounter (Signed)
Please call her It might get better with warm compresses as well

## 2011-07-13 NOTE — Telephone Encounter (Signed)
Spoke with patient and advised results   

## 2011-07-13 NOTE — Telephone Encounter (Signed)
Caller: Diane Suarez/Patient; PCP: Tillman Abide I.; CB#: [phone] ; Call regarding Pimple;  Pt calling regarding a pimple where panties meet hairline above pubis. Pt shaves pubis and noticed a "white head" on 5/2, that she popped, but now looks whitish. No drainage or knot. Afebrile and not painful. Pt trx with Vaseline. All questions negative for Skin Lesion. Home care advice given per Skin Lesion protocol. Advised pt keep clean, trx with Neasporin 2-3 x a day and try to keep underwear from rubbing in that spot. Pt verbalized understanding and will call back if not a lot better in the next few days.

## 2011-08-07 ENCOUNTER — Ambulatory Visit: Payer: 59 | Admitting: Internal Medicine

## 2011-10-09 ENCOUNTER — Telehealth: Payer: Self-pay | Admitting: Internal Medicine

## 2011-10-09 NOTE — Telephone Encounter (Signed)
Please check on her on Monday Set up appt if symptoms persist

## 2011-10-09 NOTE — Telephone Encounter (Signed)
LMP: 6/25/13Caller: Diane Suarez/Patient; PCP: Tillman Abide; CB#: [phone];  Call regarding White Spot On L Tonsil - Throat is not sore but she has had sore muscles in neck on and off like a "pinched nerve".  Neck not hurting at this time. Triage per Sore Throat and Mouth Lesions Protocol and appnt advised if sx persist > 2 weeks. Advised salt water gargle twice daily.

## 2011-10-13 NOTE — Telephone Encounter (Signed)
Spoke with patient and she's doing better thought it was a virus.

## 2011-10-30 ENCOUNTER — Telehealth: Payer: Self-pay | Admitting: Internal Medicine

## 2011-10-30 NOTE — Telephone Encounter (Signed)
Spoke with patient and advised results   

## 2011-10-30 NOTE — Telephone Encounter (Signed)
Let her know that I don't usually get concerned about low blood sugar values in non diabetics who are feeling okay. I think this is mostly due to blood handling and probably doesn't reflect the true blood sugar level. In any case, it is not an indication of diabetes. If she feels okay, I would not schedule appt at all If she continues to have concerning symptoms, I can see her next week

## 2011-10-30 NOTE — Telephone Encounter (Signed)
° °  Caller: Zikeria/Patient; Patient Name: Diane Suarez; PCP: Tillman Abide; Best Callback Phone Number: [phone].  10/29/11 she had a Biomed lab screening at work and BG was 59.  She had  eaten breakfast and had lunch at 1130 and took her blood at 1200.  She has not been dagnosed with Diabetes.  In the past few weeks she ahs also felt more tired periodicially and has had some intermittent headaches and thirst. Triaged Diabetes Control Problems  and all emergent symptoms.  Needs to be seen in 24 hours for symptoms of early hyperglycemia/hypoglycemia.  Patinet requesting follow-up from lab work. No appointments with Dr. Alphonsus Sias for 10/30/11 or 10/31/11.   Offered an appointment with another Dr., but  she prefers Dr. Alphonsus Sias.   Home care and call back instructions given.  Please call patient to advise for appointment.

## 2011-11-20 ENCOUNTER — Encounter: Payer: Self-pay | Admitting: Internal Medicine

## 2011-11-20 ENCOUNTER — Ambulatory Visit (INDEPENDENT_AMBULATORY_CARE_PROVIDER_SITE_OTHER): Payer: 59 | Admitting: Internal Medicine

## 2011-11-20 VITALS — BP 108/66 | HR 90 | Temp 97.9°F | Wt 111.5 lb

## 2011-11-20 DIAGNOSIS — R21 Rash and other nonspecific skin eruption: Secondary | ICD-10-CM | POA: Insufficient documentation

## 2011-11-20 NOTE — Patient Instructions (Addendum)
Please go ahead and set up dermatology evaluation if the rash is not better in 2-3 weeks

## 2011-11-20 NOTE — Assessment & Plan Note (Signed)
The lesions look like pityriasis rosea but only 2 and not the expected generalization Not infectious Not preneoplastic  So reassured  Would observe for now Dermatology if persists

## 2011-11-20 NOTE — Progress Notes (Signed)
  Subjective:    Patient ID: Diane Suarez, female    DOB: 1986/02/25, 26 y.o.   MRN: [id number]  HPI Having rash  Started with single dark area on lower right abdomen  Now with a smaller lesion on left abdomen Not itchy None on back No change in soaps, clothes, etc  Current Outpatient Prescriptions on File Prior to Visit  Medication Sig Dispense Refill  . valACYclovir (VALTREX) 1000 MG tablet Take 2 tablets (2,000 mg total) by mouth 2 (two) times daily.  20 tablet  2    No Known Allergies  No past medical history on file.  Past Surgical History  Procedure Date  . Dilation and curettage of uterus     for TOP    Family History  Problem Relation Age of Onset  . Hypertension Sister   . Diabetes Maternal Grandmother     History   Social History  . Marital Status: Single    Spouse Name: N/A    Number of Children: 1  . 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 Smoking status: Never Smoker   . Smokeless tobacco: Never Used  . Alcohol Use: Yes     occassionally  . Drug Use: No  . Sexually Active: Yes    Birth Control/ Protection: IUD   Other Topics Concern  . Not on file   Social History Narrative  . No narrative on file   Review of Systems No recent respiratory infections Brief white spot on tonsil---gone now No fevers    Objective:   Physical Exam  Constitutional: She appears well-developed and well-nourished. No distress.  Skin:       Single ~3x2cm scaly oval on left abdomen and single smaller scaly oval on right abdomen No clear similar lesions on back          Assessment & Plan:

## 2012-01-08 ENCOUNTER — Other Ambulatory Visit: Payer: Self-pay

## 2012-01-08 NOTE — Telephone Encounter (Signed)
Pt request written rx for first time ordering from mail order.Please advise. Call when rx ready for pick up.

## 2012-01-11 ENCOUNTER — Telehealth: Payer: Self-pay | Admitting: Internal Medicine

## 2012-01-11 MED ORDER — VALACYCLOVIR HCL 1 G PO TABS: 2000.0000 mg | ORAL_TABLET | Freq: Two times a day (BID) | ORAL | Status: DC

## 2012-01-11 NOTE — Telephone Encounter (Signed)
Spoke with patient and advised rx ready for pick-up and it will be at the front desk.  

## 2012-01-11 NOTE — Telephone Encounter (Signed)
°  Caller: [name]/Patient; Patient Name: Diane Suarez; PCP: Tillman Abide [facility]); Best Callback Phone Number: [phone].   01/08/12 she noticed that she is having more drowsiness and she had blurred vision in her left eye.  Triaged Fatigue and all emergent Sx R/O.  needs to be seen in 72 hrs for unusually frequent or ioncreased amount of (urination, not having this)  or increased thirst (is having this).   Home care and call back instructions given.  Appt made with letvak for 01/12/12 at 0800.

## 2012-01-11 NOTE — Telephone Encounter (Signed)
Will check her tomorrow as noted

## 2012-01-12 ENCOUNTER — Encounter: Payer: Self-pay | Admitting: Internal Medicine

## 2012-01-12 ENCOUNTER — Ambulatory Visit (INDEPENDENT_AMBULATORY_CARE_PROVIDER_SITE_OTHER): Payer: 59 | Admitting: Internal Medicine

## 2012-01-12 VITALS — BP 100/68 | HR 73 | Temp 98.1°F | Wt 113.0 lb

## 2012-01-12 DIAGNOSIS — R5383 Other fatigue: Secondary | ICD-10-CM

## 2012-01-12 NOTE — Assessment & Plan Note (Signed)
Non specific and mostly somnolence post prandial Blurred vision seems mechanical (?partial tear duct obstruction with film after sleeping?) No findings on exam No depression or mood issues  Reassured Will check labs also Further eval if worsens

## 2012-01-12 NOTE — Progress Notes (Signed)
Subjective:    Patient ID: Diane Suarez, female    DOB: 12-01-1985, 26 y.o.   MRN: [id number]  HPI Having some trouble with vision in left eye Blurry after nap a few days ago No discharge but tried warm compress. Eye has some watery look though Normalized after 10 minutes or so Did have trouble opening the eye earlier in the year and saw eye doctor----diagnosed with allergies (September?) Occ itching during the day--has had to take contacts out No diplopia or unilateral vision loss No known hay fever  Has been sleepy at work lately--past 2 months ago Will have snack of crackers and get somnolent (actually dozing off at desk at work) Sleeps okay No increased stress No feelings of sadness or depression Doesn't drink any caffeine so no change in intake  Works in [employer] noone else with sedation issues that she knows of  Current Outpatient Prescriptions on File Prior to Visit  Medication Sig Dispense Refill  . valACYclovir (VALTREX) 1000 MG tablet Take 2 tablets (2,000 mg total) by mouth 2 (two) times daily.  60 tablet  1    No Known Allergies  No past medical history on file.  Past Surgical History  Procedure Date  . Dilation and curettage of uterus     for TOP    Family History  Problem Relation Age of Onset  . Hypertension Sister   . Diabetes Maternal Grandmother     History   Social History  . Marital Status: Single    Spouse Name: N/A    Number of Children: 1  . 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 Smoking status: Never Smoker   . Smokeless tobacco: Never Used  . Alcohol Use: Yes     Comment: occassionally  . Drug Use: No  . Sexually Active: Yes    Birth Control/ Protection: IUD   Other Topics Concern  . Not on file   Social History Narrative  . No narrative on file   Review of Systems Appetite is increased Weight is stable No fevers No cough  or SOB No dysuria, urgency or increased frequency    Objective:   Physical Exam  Constitutional: She appears well-developed and well-nourished. No distress.  HENT:  Mouth/Throat: Oropharynx is clear and moist. No oropharyngeal exudate.  Eyes: Conjunctivae normal and EOM are normal. Pupils are equal, round, and reactive to light.       Fundi benign  Neck: Normal range of motion. Neck supple. No thyromegaly present.  Cardiovascular: Normal rate, regular rhythm and normal heart sounds.  Exam reveals no gallop.   No murmur heard. Pulmonary/Chest: Effort normal and breath sounds normal. No respiratory distress. She has no wheezes. She has no rales.  Abdominal: Soft. There is no tenderness.  Musculoskeletal: She exhibits no edema and no tenderness.  Lymphadenopathy:    She has no cervical adenopathy.    She has no axillary adenopathy.       Right: No inguinal adenopathy present.       Left: No inguinal adenopathy present.  Neurological: She is alert. She has normal strength. She displays no tremor. No cranial nerve deficit. She exhibits normal muscle tone. She displays a negative Romberg sign. Coordination and gait normal.  Psychiatric: She has a normal mood and affect. Her behavior is normal. Thought content normal.          Assessment & Plan:

## 2012-01-13 LAB — CBC WITH DIFFERENTIAL/PLATELET
Basophils Absolute: 0 10*3/uL (ref 0.0–0.2)
Eosinophils Absolute: 0.1 10*3/uL (ref 0.0–0.4)
HCT: 38.2 % (ref 34.0–46.6)
Immature Granulocytes: 0 % (ref 0–2)
Lymphocytes Absolute: 1.5 10*3/uL (ref 0.7–3.1)
MCH: 29.6 pg (ref 26.6–33.0)
MCHC: 32.5 g/dL (ref 31.5–35.7)
Monocytes: 10 % (ref 4–12)
RDW: 13.1 % (ref 12.3–15.4)

## 2012-01-13 LAB — BASIC METABOLIC PANEL
BUN/Creatinine Ratio: 12 (ref 8–20)
CO2: 24 mmol/L (ref 19–28)
Calcium: 9.1 mg/dL (ref 8.7–10.2)
Potassium: 4.3 mmol/L (ref 3.5–5.2)
Sodium: 141 mmol/L (ref 134–144)

## 2012-01-13 LAB — HEPATIC FUNCTION PANEL
ALT: 9 IU/L (ref 0–32)
AST: 16 IU/L (ref 0–40)
Alkaline Phosphatase: 24 IU/L — ABNORMAL LOW (ref 39–117)
Bilirubin, Direct: 0.14 mg/dL (ref 0.00–0.40)
Total Protein: 6.6 g/dL (ref 6.0–8.5)

## 2012-01-13 LAB — SEDIMENTATION RATE: Sed Rate: 2 mm/hr (ref 0–32)

## 2012-01-14 ENCOUNTER — Encounter: Payer: Self-pay | Admitting: *Deleted

## 2012-01-15 ENCOUNTER — Telehealth: Payer: Self-pay

## 2012-01-15 NOTE — Telephone Encounter (Signed)
Pt left v/m requesting lab results. Left v/m for pt to call back.

## 2012-01-15 NOTE — Telephone Encounter (Signed)
Spoke with patient and advised results   

## 2012-03-04 ENCOUNTER — Encounter: Payer: 59 | Admitting: Internal Medicine

## 2012-03-21 ENCOUNTER — Telehealth: Payer: Self-pay | Admitting: Internal Medicine

## 2012-03-21 ENCOUNTER — Ambulatory Visit (INDEPENDENT_AMBULATORY_CARE_PROVIDER_SITE_OTHER): Payer: 59 | Admitting: Internal Medicine

## 2012-03-21 ENCOUNTER — Encounter: Payer: Self-pay | Admitting: Internal Medicine

## 2012-03-21 VITALS — BP 110/70 | HR 95 | Temp 98.3°F | Wt 114.0 lb

## 2012-03-21 DIAGNOSIS — N6019 Diffuse cystic mastopathy of unspecified breast: Secondary | ICD-10-CM | POA: Insufficient documentation

## 2012-03-21 MED ORDER — NORETHINDRONE-ETH ESTRADIOL 1-35 MG-MCG PO TABS: 1.0000 | ORAL_TABLET | Freq: Every day | ORAL | Status: DC

## 2012-03-21 NOTE — Telephone Encounter (Signed)
Patient Information:  Caller Name: Laytoya  Phone: [phone]  Patient: Diane Suarez, Diane Suarez  Gender: Female  DOB: 1985/05/06  Age: 27 Years  PCP: Tillman Abide [facility])  Pregnant: No  Office Follow Up:  Does the office need to follow up with this patient?: No  Instructions For The Office: N/A  RN Note:  RN triaged patient per [facility] Guideline:  Breast Symptoms - Female Protocol.  See Provider within 24 hour Disposition for 'Hard, tender or red lump on breast'. RN advised OTC Ibuprofen or Tylenol and warm soaks to the breast.  Symptoms  Reason For Call & Symptoms: pt reports that her breast has been tender since her cycle has ended.  Pt reports she has 1 area that has a sharp pain  Reviewed Health History In EMR: Yes  Reviewed Medications In EMR: Yes  Reviewed Allergies In EMR: Yes  Reviewed Surgeries / Procedures: Yes  Date of Onset of Symptoms: 03/10/2012 OB / GYN:  LMP: 02/29/2012  Guideline(s) Used:  No Protocol Available - Sick Adult  Disposition Per Guideline:   See Today or Tomorrow in Office  Reason For Disposition Reached:   Nursing judgment  Advice Given:  Call Back If:  You become worse.  Appointment Scheduled:  03/21/2012 14:15:00 Appointment Scheduled Provider:  Tillman Abide [facility])

## 2012-03-21 NOTE — Patient Instructions (Addendum)
Please call if the breast pain gets troublesome again---we will proceed with a surgical evaluation

## 2012-03-21 NOTE — Telephone Encounter (Signed)
Will evaluate at the OV 

## 2012-03-21 NOTE — Assessment & Plan Note (Signed)
No pain right now Does seem to have been more of an issue before her cycle. No findings that suggest cancer on exam so will hold off on surgical eval  Will start OCP at her request Discussed ongoing condom use to protect against STD

## 2012-03-21 NOTE — Progress Notes (Signed)
  Subjective:    Patient ID: Diane Suarez, female    DOB: Nov 13, 1985, 27 y.o.   MRN: [id number]  HPI Having pain ---sharp--in left breast Around medial upper breast Started a few weeks ago and she related it to her upcoming cycle Did seem to improve but then got worse again  Intermittent sharp pain now No obvious exacerbating features---like exertion Hasn't tried any Rx Not positional  No nipple discharge  LMP 12/26 Normal for her  No sig caffeine Not a smoker No FH of breast cancer  Current Outpatient Prescriptions on File Prior to Visit  Medication Sig Dispense Refill  . valACYclovir (VALTREX) 1000 MG tablet Take 2 tablets (2,000 mg total) by mouth 2 (two) times daily.  60 tablet  1    No Known Allergies  No past medical history on file.  Past Surgical History  Procedure Date  . Dilation and curettage of uterus     for TOP    Family History  Problem Relation Age of Onset  . Hypertension Sister   . Diabetes Maternal Grandmother     History   Social History  . Marital Status: Single    Spouse Name: N/A    Number of Children: 1  . 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 Smoking status: Never Smoker   . Smokeless tobacco: Never Used  . Alcohol Use: Yes     Comment: occassionally  . Drug Use: No  . Sexually Active: Yes    Birth Control/ Protection: IUD   Other Topics Concern  . Not on file   Social History Narrative  . No narrative on file   Review of Systems Using condoms for birth control Interested in going back on OCP     Objective:   Physical Exam  Genitourinary:       Fairly large cystic mass across upper part of left breast from 11 o'clock to 1 o'clock. Not really tender now  Lymphadenopathy:    She has no axillary adenopathy.          Assessment & Plan:

## 2012-05-27 ENCOUNTER — Ambulatory Visit (INDEPENDENT_AMBULATORY_CARE_PROVIDER_SITE_OTHER): Payer: 59 | Admitting: Internal Medicine

## 2012-05-27 ENCOUNTER — Encounter: Payer: Self-pay | Admitting: Internal Medicine

## 2012-05-27 VITALS — BP 118/70 | HR 95 | Temp 98.6°F | Ht 64.0 in | Wt 111.0 lb

## 2012-05-27 DIAGNOSIS — Z0000 Encounter for general adult medical examination without abnormal findings: Secondary | ICD-10-CM

## 2012-05-27 MED ORDER — DROSPIRENONE-ETHINYL ESTRADIOL 3-0.02 MG PO TABS: 1.0000 | ORAL_TABLET | Freq: Every day | ORAL | Status: DC

## 2012-05-27 NOTE — Assessment & Plan Note (Signed)
Healthy Discussed exercise and healthy eating Will try yaz to see if she tolerates that better

## 2012-05-27 NOTE — Progress Notes (Signed)
Subjective:    Patient ID: Diane Suarez, female    DOB: September 21, 1985, 27 y.o.   MRN: [id number]  HPI Here for physical Just over infection after wisdom teeth extracted Better now  Got headaches on the birth control pills Got BV with mirena New boyfriend---regular partner  Still uses condoms every time  Had some stomach cramps yesterday May have been related to menses but was last week  No exercise---discussed Doesn't watch what she eats  No current outpatient prescriptions on file prior to visit.   No current facility-administered medications on file prior to visit.    No Known Allergies  No past medical history on file.  Past Surgical History  Procedure Laterality Date  . Dilation and curettage of uterus      for TOP    Family History  Problem Relation Age of Onset  . Hypertension Sister   . Diabetes Maternal Grandmother     History   Social History  . Marital Status: Single    Spouse Name: N/A    Number of Children: 1  . 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 Smoking status: Never Smoker   . Smokeless tobacco: Never Used  . Alcohol Use: Yes     Comment: occassionally  . Drug Use: No  . Sexually Active: Yes    Birth Control/ Protection: IUD   Other Topics Concern  . Not on file   Social History Narrative  . No narrative on file   Review of Systems  Constitutional: Negative for fatigue and unexpected weight change.       Wears seat belt  HENT: Positive for dental problem and tinnitus. Negative for hearing loss, rhinorrhea and postnasal drip.   Eyes: Negative for redness and visual disturbance.  Respiratory: Negative for cough, chest tightness and shortness of breath.   Cardiovascular: Negative for chest pain, palpitations and leg swelling.  Gastrointestinal: Positive for abdominal pain and constipation. Negative for nausea, vomiting and blood in stool.   Occ heartburn--doesn't use meds Some cramps yesterday Last year had sharp pain in RLQ-- brief  Endocrine: Positive for heat intolerance. Negative for cold intolerance.  Genitourinary: Negative for difficulty urinating and dyspareunia.       Menses have been regular  Musculoskeletal: Negative for back pain, joint swelling and arthralgias.  Skin: Negative for rash.       No suspicious lesions  Allergic/Immunologic: Negative for environmental allergies and immunocompromised state.  Neurological: Positive for headaches. Negative for dizziness, syncope, weakness, light-headedness and numbness.  Hematological: Negative for adenopathy. Does not bruise/bleed easily.  Psychiatric/Behavioral: Negative for sleep disturbance and dysphoric mood. The patient is not nervous/anxious.        Objective:   Physical Exam  Constitutional: She is oriented to person, place, and time. She appears well-developed and well-nourished. No distress.  HENT:  Head: Normocephalic and atraumatic.  Right Ear: External ear normal.  Left Ear: External ear normal.  Mouth/Throat: Oropharynx is clear and moist.  Eyes: Conjunctivae and EOM are normal. Pupils are equal, round, and reactive to light.  Neck: Normal range of motion. Neck supple. No thyromegaly present.  Cardiovascular: Normal rate, regular rhythm, normal heart sounds and intact distal pulses.  Exam reveals no gallop.   No murmur heard. Pulmonary/Chest: Effort normal and breath sounds normal. No respiratory distress. She has no wheezes. She has no rales.  Abdominal: Soft. There is no tenderness.  Genitourinary:  Normal intoitus and cervix Pap done Bimanual negative  Musculoskeletal: She exhibits no edema and no tenderness.  Lymphadenopathy:    She has no cervical adenopathy.    She has no axillary adenopathy.  Neurological: She is alert and oriented to person, place, and time.  Skin: No rash noted. No erythema.  Psychiatric: She has a normal mood and  affect. Her behavior is normal.          Assessment & Plan:

## 2012-05-30 ENCOUNTER — Telehealth: Payer: Self-pay

## 2012-05-30 NOTE — Telephone Encounter (Signed)
Pt seen 05/27/12 and request pap results; advised are not back yet and pt request call when results come in.

## 2012-05-30 NOTE — Telephone Encounter (Signed)
Will call when results are back

## 2012-06-16 ENCOUNTER — Encounter: Payer: Self-pay | Admitting: Internal Medicine

## 2012-06-23 ENCOUNTER — Ambulatory Visit (INDEPENDENT_AMBULATORY_CARE_PROVIDER_SITE_OTHER): Payer: 59 | Admitting: Family Medicine

## 2012-06-23 ENCOUNTER — Encounter: Payer: Self-pay | Admitting: Family Medicine

## 2012-06-23 VITALS — BP 120/62 | HR 60 | Temp 98.2°F | Wt 114.0 lb

## 2012-06-23 DIAGNOSIS — L0291 Cutaneous abscess, unspecified: Secondary | ICD-10-CM

## 2012-06-23 DIAGNOSIS — L0390 Cellulitis, unspecified: Secondary | ICD-10-CM

## 2012-06-23 MED ORDER — DOXYCYCLINE HYCLATE 100 MG PO TABS: 100.0000 mg | ORAL_TABLET | Freq: Two times a day (BID) | ORAL | Status: DC

## 2012-06-23 NOTE — Progress Notes (Signed)
  Subjective:    Patient ID: Diane Suarez, female    DOB: March 07, 1986, 27 y.o.   MRN: [id number]  HPI  27 yo very pleasant female here for ?infected naval.  Got a new naval piercing a few days ago.  Since she got it pierced, area has been red and inflamed.  She took it out yesterday and now pus has been draining and "a little bump has formed."  No fevers or chills.  Has not put the piercing back in.  Patient Active Problem List  Diagnosis  . Fibrocystic breast changes  . Cellulitis and abscess of unspecified site   No past medical history on file. Past Surgical History  Procedure Laterality Date  . Dilation and curettage of uterus      for TOP   History  Substance Use Topics  . Smoking status: Never Smoker   . Smokeless tobacco: Never Used  . Alcohol Use: Yes     Comment: occassionally   Family History  Problem Relation Age of Onset  . Hypertension Sister   . Diabetes Maternal Grandmother    No Known Allergies Current Outpatient Prescriptions on File Prior to Visit  Medication Sig Dispense Refill  . drospirenone-ethinyl estradiol (YAZ,GIANVI,LORYNA) 3-0.02 MG tablet Take 1 tablet by mouth daily.  1 Package  11   No current facility-administered medications on file prior to visit.   The PMH, PSH, Social History, Family History, Medications, and allergies have been reviewed in [facility], and have been updated if relevant.    Review of Systems See HPI    Objective:   Physical Exam  Constitutional: She appears well-developed and well-nourished. No distress.  Abdominal: Bowel sounds are normal. She exhibits no distension.  Skin:       BP 120/62  Pulse 60  Temp(Src) 98.2 F (36.8 C)  Wt 114 lb (51.71 kg)  BMI 19.56 kg/m2       Assessment & Plan:  1. Cellulitis and abscess of unspecified site New.  Advised warm compresses.  Will place on doxycyline 100 mg twice daily x 10 days. Call or return to clinic prn if these symptoms worsen or fail to improve as  anticipated. The patient indicates understanding of these issues and agrees with the plan. See AVS for further instructions.

## 2012-06-23 NOTE — Patient Instructions (Addendum)
Please use condoms while taking antibiotics and for several days afterward. Take doxycyline 100 mg twice daily for 10 days.  Place warm compresses on it several times per day. Call us next week.

## 2012-07-11 ENCOUNTER — Encounter: Payer: 59 | Admitting: Internal Medicine

## 2012-07-18 ENCOUNTER — Telehealth: Payer: Self-pay

## 2012-07-18 NOTE — Telephone Encounter (Signed)
Per patient she did well on a birth control pill in high school but can't remember the name. This new one was started in march 2014 per pt

## 2012-07-18 NOTE — Telephone Encounter (Signed)
Find out if she has ever taken one that she did well with

## 2012-07-18 NOTE — Telephone Encounter (Signed)
Pt left v/m birth control pills are causing pt to have acne on chest and shoulders and pt request different BC pill [facility] [address].Please advise.

## 2012-07-18 NOTE — Telephone Encounter (Signed)
Okay to change to generic of ortho novum 1/35 #1 pack x 11 Have her let us know if she has trouble with this one also

## 2012-07-19 MED ORDER — NORETHINDRONE-ETH ESTRADIOL 1-35 MG-MCG PO TABS: 1.0000 | ORAL_TABLET | Freq: Every day | ORAL | Status: DC

## 2012-07-19 NOTE — Telephone Encounter (Signed)
Spoke with patient and advised results rx sent to pharmacy by e-script  

## 2012-08-24 ENCOUNTER — Telehealth: Payer: Self-pay | Admitting: Internal Medicine

## 2012-08-24 NOTE — Telephone Encounter (Signed)
Patient Information:  Caller Name: Zina  Phone: [phone]  Patient: Diane Suarez, Diane Suarez  Gender: Female  DOB: 1986/01/07  Age: 27 Years  PCP: Tillman Abide [facility])  Pregnant: No  Office Follow Up:  Does the office need to follow up with this patient?: No  Instructions For The Office: N/A   Symptoms  Reason For Call & Symptoms: woke up 08/18/12 with a sore throat; had some white patches in her throat, but they are gone; only red now; cough started 6/17; throat is hoarse; had someonw in the house that had pneumonia  Reviewed Health History In EMR: Yes  Reviewed Medications In EMR: Yes  Reviewed Allergies In EMR: Yes  Reviewed Surgeries / Procedures: Yes  Date of Onset of Symptoms: 08/18/2012 OB / GYN:  LMP: Unknown  Guideline(s) Used:  Sore Throat  Disposition Per Guideline:   Strep Test Only Visit Today or Tomorrow  Reason For Disposition Reached:   Sore throat with cough/cold symptoms present > 5 days  Advice Given:  N/A  Patient Will Follow Care Advice:  YES  Appointment Scheduled:  08/26/2012 14:30:00 Appointment Scheduled Provider:  Kerby Nora (Family Practice)  Requests Friday appt

## 2012-08-26 ENCOUNTER — Ambulatory Visit (INDEPENDENT_AMBULATORY_CARE_PROVIDER_SITE_OTHER): Payer: 59 | Admitting: Family Medicine

## 2012-08-26 ENCOUNTER — Encounter: Payer: Self-pay | Admitting: Family Medicine

## 2012-08-26 VITALS — BP 128/66 | HR 90 | Temp 99.0°F | Wt 114.2 lb

## 2012-08-26 DIAGNOSIS — J029 Acute pharyngitis, unspecified: Secondary | ICD-10-CM

## 2012-08-26 DIAGNOSIS — J309 Allergic rhinitis, unspecified: Secondary | ICD-10-CM

## 2012-08-26 NOTE — Progress Notes (Signed)
  Subjective:    Patient ID: Diane Suarez, female    DOB: 24-May-1985, 27 y.o.   MRN: [id number]  Sore Throat  This is a new problem. The current episode started in the past 7 days (1 week ago). The problem has been gradually worsening. Neither side of throat is experiencing more pain than the other. There has been no fever. The pain is moderate. Associated symptoms include congestion, coughing, headaches, a hoarse voice and trouble swallowing. Pertinent negatives include no ear discharge, ear pain, plugged ear sensation, shortness of breath, swollen glands or vomiting. Associated symptoms comments: Post nasal drip, no sinus pain, occ sneezing. She has had no exposure to strep or mono. Treatments tried: sinus medicaiton.. nyquil. The treatment provided mild relief.   Nonsmoker.   Review of Systems  HENT: Positive for congestion, hoarse voice and trouble swallowing. Negative for ear pain and ear discharge.   Respiratory: Positive for cough. Negative for shortness of breath.   Gastrointestinal: Negative for vomiting.  Neurological: Positive for headaches.       Objective:   Physical Exam  Constitutional: Vital signs are normal. She appears well-developed and well-nourished. She is cooperative.  Non-toxic appearance. She does not appear ill. No distress.  HENT:  Head: Normocephalic.  Right Ear: Hearing, tympanic membrane, external ear and ear canal normal. Tympanic membrane is not erythematous, not retracted and not bulging.  Left Ear: Hearing, tympanic membrane, external ear and ear canal normal. Tympanic membrane is not erythematous, not retracted and not bulging.  Nose: Mucosal edema and rhinorrhea present. Right sinus exhibits no maxillary sinus tenderness and no frontal sinus tenderness. Left sinus exhibits no maxillary sinus tenderness and no frontal sinus tenderness.  Mouth/Throat: Uvula is midline, oropharynx is clear and moist and mucous membranes are normal.  Eyes: Conjunctivae, EOM  and lids are normal. Pupils are equal, round, and reactive to light. No foreign bodies found.  Neck: Trachea normal and normal range of motion. Neck supple. Carotid bruit is not present. No mass and no thyromegaly present.  Cardiovascular: Normal rate, regular rhythm, S1 normal, S2 normal, normal heart sounds, intact distal pulses and normal pulses.  Exam reveals no gallop and no friction rub.   No murmur heard. Pulmonary/Chest: Effort normal and breath sounds normal. Not tachypneic. No respiratory distress. She has no decreased breath sounds. She has no wheezes. She has no rhonchi. She has no rales.  Neurological: She is alert.  Skin: Skin is warm, dry and intact. No rash noted.  Psychiatric: Her speech is normal and behavior is normal. Judgment normal. Her mood appears not anxious. Cognition and memory are normal. She does not exhibit a depressed mood.          Assessment & Plan:

## 2012-08-26 NOTE — Patient Instructions (Addendum)
Start zyrtec at bedtime. Start nasal saline irrigation 2-3 times day. Can use OTC cough suppressant if needed. Call if not improving in the next 5-7 days.

## 2012-08-26 NOTE — Assessment & Plan Note (Signed)
Treat with antihistamine, symptomatic care and nasal saline irrigation. Possible viral infection.. Symptomatic care. No sign of bacterial infection at this time.

## 2012-11-15 ENCOUNTER — Encounter: Payer: Self-pay | Admitting: Internal Medicine

## 2012-11-15 ENCOUNTER — Ambulatory Visit (INDEPENDENT_AMBULATORY_CARE_PROVIDER_SITE_OTHER): Payer: 59 | Admitting: Internal Medicine

## 2012-11-15 VITALS — BP 118/64 | HR 92 | Temp 98.0°F | Wt 112.0 lb

## 2012-11-15 DIAGNOSIS — N939 Abnormal uterine and vaginal bleeding, unspecified: Secondary | ICD-10-CM

## 2012-11-15 DIAGNOSIS — B379 Candidiasis, unspecified: Secondary | ICD-10-CM

## 2012-11-15 DIAGNOSIS — N898 Other specified noninflammatory disorders of vagina: Secondary | ICD-10-CM

## 2012-11-15 DIAGNOSIS — N390 Urinary tract infection, site not specified: Secondary | ICD-10-CM

## 2012-11-15 LAB — POCT URINE PREGNANCY: Preg Test, Ur: NEGATIVE

## 2012-11-15 MED ORDER — FLUCONAZOLE 150 MG PO TABS: 150.0000 mg | ORAL_TABLET | Freq: Once | ORAL | Status: DC

## 2012-11-15 MED ORDER — CIPROFLOXACIN HCL 500 MG PO TABS: 500.0000 mg | ORAL_TABLET | Freq: Two times a day (BID) | ORAL | Status: DC

## 2012-11-15 NOTE — Addendum Note (Signed)
Addended by: Eliezer Bottom on: 11/15/2012 03:07 PM   Modules accepted: Orders

## 2012-11-15 NOTE — Patient Instructions (Signed)
Urinary Tract Infection  Urinary tract infections (UTIs) can develop anywhere along your urinary tract. Your urinary tract is your body's drainage system for removing wastes and extra water. Your urinary tract includes two kidneys, two ureters, a bladder, and a urethra. Your kidneys are a pair of bean-shaped organs. Each kidney is about the size of your fist. They are located below your ribs, one on each side of your spine.  CAUSES  Infections are caused by microbes, which are microscopic organisms, including fungi, viruses, and bacteria. These organisms are so small that they can only be seen through a microscope. Bacteria are the microbes that most commonly cause UTIs.  SYMPTOMS   Symptoms of UTIs may vary by age and gender of the patient and by the location of the infection. Symptoms in young women typically include a frequent and intense urge to urinate and a painful, burning feeling in the bladder or urethra during urination. Older women and men are more likely to be tired, shaky, and weak and have muscle aches and abdominal pain. A fever may mean the infection is in your kidneys. Other symptoms of a kidney infection include pain in your back or sides below the ribs, nausea, and vomiting.  DIAGNOSIS  To diagnose a UTI, your caregiver will ask you about your symptoms. Your caregiver also will ask to provide a urine sample. The urine sample will be tested for bacteria and white blood cells. White blood cells are made by your body to help fight infection.  TREATMENT   Typically, UTIs can be treated with medication. Because most UTIs are caused by a bacterial infection, they usually can be treated with the use of antibiotics. The choice of antibiotic and length of treatment depend on your symptoms and the type of bacteria causing your infection.  HOME CARE INSTRUCTIONS   If you were prescribed antibiotics, take them exactly as your caregiver instructs you. Finish the medication even if you feel better after you  have only taken some of the medication.   Drink enough water and fluids to keep your urine clear or pale yellow.   Avoid caffeine, tea, and carbonated beverages. They tend to irritate your bladder.   Empty your bladder often. Avoid holding urine for long periods of time.   Empty your bladder before and after sexual intercourse.   After a bowel movement, women should cleanse from front to back. Use each tissue only once.  SEEK MEDICAL CARE IF:    You have back pain.   You develop a fever.   Your symptoms do not begin to resolve within 3 days.  SEEK IMMEDIATE MEDICAL CARE IF:    You have severe back pain or lower abdominal pain.   You develop chills.   You have nausea or vomiting.   You have continued burning or discomfort with urination.  MAKE SURE YOU:    Understand these instructions.   Will watch your condition.   Will get help right away if you are not doing well or get worse.  Document Released: 12/03/2004 Document Revised: 08/25/2011 Document Reviewed: 04/03/2011  ExitCare Patient Information 2014 [employer].

## 2012-11-15 NOTE — Progress Notes (Signed)
HPI  Pt presents to the clinic today with c/o painful urination and blood in her urine. This started Sunday. She denies fever, chills, back pain, nausea or vomiting. She took AZO otc, which did provide some relief. Her LMP on 10/29/2012. She is sexually active but on OCP's. She has not been taking her OCP's as directed She denies pain with intercourse, vaginal discharge or odor. She has had UTI's in the past and reports this feels the same.   Review of Systems  History reviewed. No pertinent past medical history.  Family History  Problem Relation Age of Onset  . Hypertension Sister   . Diabetes Maternal Grandmother     History   Social History  . Marital Status: Single    Spouse Name: N/A    Number of Children: 1  . 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 Smoking status: Never Smoker   . Smokeless tobacco: Never Used  . Alcohol Use: Yes     Comment: occassionally  . Drug Use: No  . Sexual Activity: Yes    Birth Control/ Protection: IUD   Other Topics Concern  . Not on file   Social History Narrative  . No narrative on file    No Known Allergies  Constitutional: Denies fever, malaise, fatigue, headache or abrupt weight changes.   GU: Pt reports urgency, frequency and pain with urination. Denies burning sensation, blood in urine, odor or discharge. Skin: Denies redness, rashes, lesions or ulcercations.   No other specific complaints in a complete review of systems (except as listed in HPI above).    Objective:   Physical Exam  BP 118/64  Pulse 92  Temp(Src) 98 F (36.7 C)  Wt 112 lb (50.803 kg)  BMI 19.22 kg/m2 Wt Readings from Last 3 Encounters:  11/15/12 112 lb (50.803 kg)  08/26/12 114 lb 4 oz (51.823 kg)  06/23/12 114 lb (51.71 kg)    General: Appears her stated age, well developed, well nourished in NAD. Cardiovascular: Normal rate and rhythm. S1,S2 noted.  No murmur,  rubs or gallops noted. No JVD or BLE edema. No carotid bruits noted. Pulmonary/Chest: Normal effort and positive vesicular breath sounds. No respiratory distress. No wheezes, rales or ronchi noted.  Abdomen: Soft and nontender. Normal bowel sounds, no bruits noted. No distention or masses noted. Liver, spleen and kidneys non palpable. Tender to palpation over the bladder area. No CVA tenderness.      Assessment & Plan:   Dysuria and Hematuria secondary to possible UTI:  Urinalysis- AZO interfering with POCT will send for urine culture Urine preg negative(pt request) eRx for Cipro BID x 3 days eRx for Diflucan 150 mg x 1 (she reports she always gets yeast infections with antibiotics) Drink plenty of fluids  RTC as needed or if symptoms persist.

## 2013-01-12 ENCOUNTER — Other Ambulatory Visit: Payer: Self-pay

## 2013-07-03 ENCOUNTER — Ambulatory Visit (INDEPENDENT_AMBULATORY_CARE_PROVIDER_SITE_OTHER): Payer: 59 | Admitting: Internal Medicine

## 2013-07-03 ENCOUNTER — Encounter: Payer: Self-pay | Admitting: Internal Medicine

## 2013-07-03 VITALS — BP 100/60 | HR 71 | Temp 98.3°F | Wt 116.0 lb

## 2013-07-03 DIAGNOSIS — Z309 Encounter for contraceptive management, unspecified: Secondary | ICD-10-CM | POA: Insufficient documentation

## 2013-07-03 LAB — POCT URINE PREGNANCY: Preg Test, Ur: NEGATIVE

## 2013-07-03 MED ORDER — MEDROXYPROGESTERONE ACETATE 150 MG/ML IM SUSP
150.0000 mg | Freq: Once | INTRAMUSCULAR | Status: AC
  Administered 2013-07-03: 150 mg via INTRAMUSCULAR

## 2013-07-03 NOTE — Addendum Note (Signed)
Addended by: Sueanne MargaritaSMITH, Pammie Chirino L on: 07/03/2013 03:56 PM   Modules accepted: Orders

## 2013-07-03 NOTE — Assessment & Plan Note (Signed)
Counseled on options now---limited due to her past issues Okay to try depoprovera---though it could give irregular or extended bleeding also Recommended contraceptive foam for now with the condoms  Discussed for 15 minutes

## 2013-07-03 NOTE — Progress Notes (Signed)
   Subjective:    Patient ID: Diane Suarez, female    DOB:Corky Sing 11/19/1985, 28 y.o.   MRN: [id number]  HPI Wants to discuss birth control Can't remember the birth control pills BV with mirena Had implant some years ago---for 1.5 years of so. Had 2 week cycles every month even then (so removed)  Same partner as a year ago He uses condoms every time Doesn't use contraceptive foam  No current outpatient prescriptions on file prior to visit.   No current facility-administered medications on file prior to visit.    No Known Allergies  No past medical history on file.  Past Surgical History  Procedure Laterality Date  . Dilation and curettage of uterus      for TOP    Family History  Problem Relation Age of Onset  . Hypertension Sister   . Diabetes Maternal Grandmother     History   Social History  . Marital Status: Single    Spouse Name: N/A    Number of Children: 1  . 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 Smoking status: Never Smoker   . Smokeless tobacco: Never Used  . Alcohol Use: Yes     Comment: occassionally  . Drug Use: No  . Sexual Activity: Yes    Birth Control/ Protection: IUD   Other Topics Concern  . Not on file   Social History Narrative  . No narrative on file   Review of Systems     Objective:   Physical Exam        Assessment & Plan:

## 2013-10-03 ENCOUNTER — Telehealth: Payer: Self-pay

## 2013-10-03 ENCOUNTER — Ambulatory Visit: Payer: 59

## 2013-10-03 NOTE — Telephone Encounter (Signed)
Pt was seen 07/03/13 for depo injection; pt was scheduled for nurse visit for 10/03/13 which is outside Depo perpetual calendar. Pt has been doing OK after depo injection in April. Pt has noticed some breast tenderness,slight spotting in last couple of days.  When discussed with pt about depo guidelines when late for injection; pt said she wants to wait 2 weeks before getting Depo and do pregnancy test at that time. Pt wanted to know if had to have pregnancy test today. Spoke with Carlena SaxBlair RN team lead and Dr Para Marchuncan and was advised can wait 2 weeks for pregnancy test and if neg can get depo injection then; between now and then pt will use another form of birth control. Pt voiced understanding and rescheduled nurse visit.

## 2013-10-17 ENCOUNTER — Ambulatory Visit (INDEPENDENT_AMBULATORY_CARE_PROVIDER_SITE_OTHER): Payer: 59

## 2013-10-17 DIAGNOSIS — Z309 Encounter for contraceptive management, unspecified: Secondary | ICD-10-CM

## 2013-10-17 DIAGNOSIS — Z36 Encounter for antenatal screening of mother: Secondary | ICD-10-CM

## 2013-10-17 DIAGNOSIS — Z3689 Encounter for other specified antenatal screening: Secondary | ICD-10-CM

## 2013-10-17 LAB — POCT URINE PREGNANCY: Preg Test, Ur: NEGATIVE

## 2013-10-17 MED ORDER — MEDROXYPROGESTERONE ACETATE 150 MG/ML IM SUSP
150.0000 mg | Freq: Once | INTRAMUSCULAR | Status: AC
  Administered 2013-10-17: 150 mg via INTRAMUSCULAR

## 2014-01-02 ENCOUNTER — Ambulatory Visit (INDEPENDENT_AMBULATORY_CARE_PROVIDER_SITE_OTHER): Payer: 59

## 2014-01-02 DIAGNOSIS — Z3042 Encounter for surveillance of injectable contraceptive: Secondary | ICD-10-CM

## 2014-01-02 MED ORDER — MEDROXYPROGESTERONE ACETATE 150 MG/ML IM SUSP
150.0000 mg | Freq: Once | INTRAMUSCULAR | Status: AC
  Administered 2014-01-02: 150 mg via INTRAMUSCULAR

## 2014-01-05 ENCOUNTER — Encounter: Payer: 59 | Admitting: Internal Medicine

## 2014-01-25 ENCOUNTER — Encounter: Payer: Self-pay | Admitting: Internal Medicine

## 2014-01-25 ENCOUNTER — Ambulatory Visit (INDEPENDENT_AMBULATORY_CARE_PROVIDER_SITE_OTHER): Payer: 59 | Admitting: Internal Medicine

## 2014-01-25 VITALS — BP 100/60 | HR 102 | Temp 98.0°F | Ht 64.0 in | Wt 116.0 lb

## 2014-01-25 DIAGNOSIS — Z3042 Encounter for surveillance of injectable contraceptive: Secondary | ICD-10-CM

## 2014-01-25 DIAGNOSIS — Z0000 Encounter for general adult medical examination without abnormal findings: Secondary | ICD-10-CM

## 2014-01-25 NOTE — Assessment & Plan Note (Signed)
Satisfied with the depoprovera Will check HIV and urine NAT (if we can)

## 2014-01-25 NOTE — Progress Notes (Signed)
Pre visit review using our clinic review tool, if applicable. No additional management support is needed unless otherwise documented below in the visit note. 

## 2014-01-25 NOTE — Addendum Note (Signed)
Addended by: Alvina ChouWALSH, TERRI J on: 01/25/2014 05:10 PM   Modules accepted: Orders

## 2014-01-25 NOTE — Progress Notes (Signed)
Subjective:    Patient ID: Diane Suarez, female    DOB: 03/31/1985, 28 y.o.   MRN: [id number]  HPI Here for physical Doing okay with depoprovera  No new concerns Still with same guy Sporadic with condoms  No current outpatient prescriptions on file prior to visit.   No current facility-administered medications on file prior to visit.    No Known Allergies  No past medical history on file.  Past Surgical History  Procedure Laterality Date  . Dilation and curettage of uterus      for TOP    Family History  Problem Relation Age of Onset  . Hypertension Sister   . Diabetes Maternal Grandmother     History   Social History  . Marital Status: Single    Spouse Name: N/A    Number of Children: 1  . 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 Smoking status: Never Smoker   . Smokeless tobacco: Never Used  . Alcohol Use: Yes     Comment: occassionally  . Drug Use: No  . Sexual Activity: Yes    Birth Control/ Protection: IUD   Other Topics Concern  . Not on file   Social History Narrative   Review of Systems  Constitutional: Negative for fatigue and unexpected weight change.       Wears seat belt  HENT: Positive for tinnitus. Negative for dental problem and hearing loss.        Occ right ear tinnitus  Eyes: Negative for visual disturbance.       No diplopia or unilateral vision loss  Gastrointestinal: Negative for nausea, vomiting, abdominal pain, constipation and blood in stool.       Occ low abd cramping when depo due  Endocrine: Negative for polydipsia and polyuria.  Genitourinary: Negative for dysuria, hematuria, difficulty urinating and dyspareunia.       Starts bleeding when due for next shot of depo  Musculoskeletal: Negative for back pain, joint swelling and arthralgias.  Skin: Negative for rash.       No suspicious lesions  Allergic/Immunologic: Positive for  environmental allergies. Negative for immunocompromised state.       Satisfied with OTC meds  Neurological: Negative for dizziness, syncope, weakness, light-headedness, numbness and headaches.  Hematological: Negative for adenopathy. Does not bruise/bleed easily.  Psychiatric/Behavioral: Negative for sleep disturbance and dysphoric mood. The patient is not nervous/anxious.        Objective:   Physical Exam  Constitutional: She is oriented to person, place, and time. She appears well-developed and well-nourished. No distress.  HENT:  Head: Normocephalic and atraumatic.  Right Ear: External ear normal.  Left Ear: External ear normal.  Mouth/Throat: Oropharynx is clear and moist. No oropharyngeal exudate.  Eyes: Conjunctivae and EOM are normal. Pupils are equal, round, and reactive to light.  Neck: Normal range of motion. Neck supple. No thyromegaly present.  Cardiovascular: Normal rate, regular rhythm, normal heart sounds and intact distal pulses.  Exam reveals no gallop.   No murmur heard. Pulmonary/Chest: Effort normal and breath sounds normal. No respiratory distress. She has no wheezes. She has no rales.  Abdominal: Soft. There is no tenderness.  Genitourinary:  No worrisome breast masses or tenderness. Mild bilateral cystic changes  Musculoskeletal: She exhibits no edema or tenderness.  Lymphadenopathy:    She has no cervical adenopathy.  Neurological: She is alert and oriented to person, place, and  time.  Skin: No rash noted. No erythema.  Psychiatric: She has a normal mood and affect. Her behavior is normal.          Assessment & Plan:

## 2014-01-25 NOTE — Assessment & Plan Note (Signed)
Healthy Counseling done Will check lipid and glucose

## 2014-01-26 LAB — LIPID PANEL
CHOLESTEROL TOTAL: 134 mg/dL (ref 100–199)
Chol/HDL Ratio: 1.9 ratio units (ref 0.0–4.4)
HDL: 69 mg/dL (ref >39)
LDL Calculated: 55 mg/dL (ref 0–99)
TRIGLYCERIDES: 48 mg/dL (ref 0–149)
VLDL CHOLESTEROL CAL: 10 mg/dL (ref 5–40)

## 2014-01-26 LAB — GLUCOSE, RANDOM: Glucose, Plasma: 86 mg/dL (ref 65–99)

## 2014-01-26 LAB — HIV ANTIBODY (ROUTINE TESTING W REFLEX)
HIV 1/O/2 Abs-Index Value: <1 (ref <1.00)
HIV-1/HIV-2 Ab: NONREACTIVE

## 2014-01-27 LAB — GC/CHLAMYDIA PROBE AMP
Chlamydia trachomatis, NAA: POSITIVE — AB
Neisseria gonorrhoeae by PCR: NEGATIVE

## 2014-01-29 ENCOUNTER — Encounter: Payer: Self-pay | Admitting: Internal Medicine

## 2014-01-29 ENCOUNTER — Other Ambulatory Visit: Payer: Self-pay | Admitting: Internal Medicine

## 2014-01-29 MED ORDER — AZITHROMYCIN 500 MG PO TABS: 1000.0000 mg | ORAL_TABLET | Freq: Once | ORAL | Status: DC

## 2014-02-26 ENCOUNTER — Telehealth: Payer: Self-pay

## 2014-02-26 NOTE — Telephone Encounter (Signed)
Pt left v/m requesting cb; pt has some questions about Depo shot. Left v/m requesting cb.

## 2014-03-12 NOTE — Telephone Encounter (Signed)
That sounds fine She should start something by the end of this month---have her set up appt

## 2014-03-12 NOTE — Telephone Encounter (Signed)
Irregular bleeding while on depo shots is the most common side effect--although it is more common soon after starting than after being on it for a while. If she is not having other symptoms---like abdominal pain, vaginal discharge, urinary symptoms, etc--- it is probably best to just wait it out for now

## 2014-03-12 NOTE — Telephone Encounter (Signed)
Spoke with patient and advised results  She will call back

## 2014-03-12 NOTE — Telephone Encounter (Signed)
Pt left v/m; pt has had menstrual period for 2 weeks; menstrual flow was normal when started;last week menstrual period stopped, but this morning pt restarted with normal flow of a regular menstrual period; this is the first time this has happened. No abdominal or back pain and no fever. No cramping and no clots passed.pt had last depo injection 01/02/14. Pt did have dizziness on and off last week and patch of hives on back last week; no dizziness or hives this week.[facility] [address]

## 2014-03-12 NOTE — Telephone Encounter (Signed)
Spoke with patient and advised results, per pt she does not like the depo and would like something else, she will cancel her next depo and on her next visit discuss other alternatives.

## 2014-03-28 ENCOUNTER — Ambulatory Visit: Payer: 59

## 2014-04-24 ENCOUNTER — Telehealth: Payer: Self-pay

## 2014-04-24 NOTE — Telephone Encounter (Signed)
Diane GulaAngela Harris with [facility] left v/m requesting cb about needing to get info in registry for treatment of chlamydia. Left v/m requesting Diane GulaAngela Harris to cb.

## 2014-04-25 NOTE — Telephone Encounter (Signed)
[name] with [facility] left v/m on 04/24/14 requesting cb. Left v/m for Diane to cb. Called Diane back this AM and spoke with her;Diane needed info of treatment for Chlamydia. Advised on 01/30/15 Dr Alphonsus SiasLetvak answered pt email and pt was prescribed azithromycin 500 mg taking 2 tabs once. Pt was to cb if any problems. Diane voiced understanding.

## 2014-09-12 ENCOUNTER — Telehealth: Payer: Self-pay | Admitting: Internal Medicine

## 2014-09-12 NOTE — Telephone Encounter (Signed)
Left VM that vaccine record at the front ready for pick-up

## 2014-09-12 NOTE — Telephone Encounter (Signed)
Pt needs copy of immunization records for new job. Please call either cell or [phone] when ready to be picked up , thanks.

## 2014-12-19 ENCOUNTER — Ambulatory Visit (INDEPENDENT_AMBULATORY_CARE_PROVIDER_SITE_OTHER): Payer: Commercial Managed Care - PPO | Admitting: Internal Medicine

## 2014-12-19 ENCOUNTER — Encounter: Payer: Self-pay | Admitting: Internal Medicine

## 2014-12-19 ENCOUNTER — Telehealth: Payer: Self-pay | Admitting: Internal Medicine

## 2014-12-19 VITALS — BP 110/70 | HR 53 | Temp 97.9°F | Wt 114.0 lb

## 2014-12-19 DIAGNOSIS — R0789 Other chest pain: Secondary | ICD-10-CM | POA: Insufficient documentation

## 2014-12-19 DIAGNOSIS — R079 Chest pain, unspecified: Secondary | ICD-10-CM | POA: Diagnosis not present

## 2014-12-19 NOTE — Telephone Encounter (Signed)
Pt has appt with Dr Alphonsus SiasLetvak 12/19/14 at 12:30 pm.

## 2014-12-19 NOTE — Telephone Encounter (Signed)
Will evaluate then 

## 2014-12-19 NOTE — Progress Notes (Signed)
   Subjective:    Patient ID: Diane Suarez, female    DOB: 08/30/1985, 29 y.o.   MRN: [id number]  HPI Having some chest pain  Having pain over mid sternum---if she moves the wrong way or with deep breath Had cough a few weeks ago--this is better No SOB No fever  Started yesterday Might have picked up daughter wrong--- thinks she may have injured herself  No exercise---but doesn't notice when active unless moves (like stretching or turning)  Hasn't tried anything for this  No current outpatient prescriptions on file prior to visit.   No current facility-administered medications on file prior to visit.    No Known Allergies  No past medical history on file.  Past Surgical History  Procedure Laterality Date  . Dilation and curettage of uterus      for TOP    Family History  Problem Relation Age of Onset  . Hypertension Sister   . Diabetes Maternal Grandmother     Social History   Social History  . Marital Status: Single    Spouse Name: N/A  . Number of Children: 1  . 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 Smoking status: Never Smoker   . Smokeless tobacco: Never Used  . Alcohol Use: Yes     Comment: occassionally  . Drug Use: No  . Sexual Activity: Yes    Birth Control/ Protection: IUD   Other Topics Concern  . Not on file   Social History Narrative    Review of Systems No GI problems--no vomiting or diarrhea Appetite is fine No dizziness or syncope No palpitations    Objective:   Physical Exam  Constitutional: She appears well-developed and well-nourished. No distress.  Neck: Normal range of motion. Neck supple. No thyromegaly present.  Cardiovascular: Normal rate, regular rhythm, normal heart sounds and intact distal pulses.  Exam reveals no gallop and no friction rub.   No murmur heard. Pulmonary/Chest: Effort normal and breath sounds normal. No respiratory  distress. She has no wheezes. She has no rales. She exhibits tenderness.  Pain reproduced by light pressure over upper sternum and posterior movement of right arm  Abdominal: Soft. There is no tenderness.  Musculoskeletal: She exhibits no edema.  Lymphadenopathy:    She has no cervical adenopathy.          Assessment & Plan:

## 2014-12-19 NOTE — Progress Notes (Signed)
Pre visit review using our clinic review tool, if applicable. No additional management support is needed unless otherwise documented below in the visit note. 

## 2014-12-19 NOTE — Assessment & Plan Note (Signed)
Clearly soft tissue No reason to consider MI or PE Reassured Discussed OTC analgesics

## 2014-12-19 NOTE — Telephone Encounter (Signed)
PLEASE NOTE: All timestamps contained within this report are represented as [place] Standard Time. CONFIDENTIALTY NOTICE: This fax transmission is intended only for the addressee. It contains information that is legally privileged, confidential or otherwise protected from use or disclosure. If you are not the intended recipient, you are strictly prohibited from reviewing, disclosing, copying using or disseminating any of this information or taking any action in reliance on or regarding this information. If you have received this fax in error, please notify us immediately by telephone so that we can arrange for its return to us. Phone: [phone], Toll-Free: [phone], Fax: [phone] Page: 1 of 2 Call Id: [id number] [facility] Client TELEPHONE ADVICE RECORD [facility] Patient Name: Diane Suarez Gender: Female DOB: 08/14/1985 Age: [age] Y 4 M 4 D Return Phone Number: [phone] (Primary) Address: City/State/Zip: [place] Client [facility]  [facility] Physician Tillman AbideLetvak, Richard Contact Type Call Call Type Triage / Clinical Relationship To Patient Self Appointment Disposition EMR Appointment Scheduled Info pasted into Epic Yes Return Phone Number [phone] (Primary) Chief Complaint CHEST PAIN (>=21 years) - pain, pressure, heaviness or tightness Initial Comment Caller states she is having chest pains. PreDisposition Call Doctor Nurse Assessment Nurse: Renaldo FiddlerAdkins, RN, Raynelle FanningJulie Date/Time Lamount Cohen(Eastern Time): 12/19/2014 8:28:16 AM Confirm and document reason for call. If symptomatic, describe symptoms. ---Caller states she is having chest pain, it hurts to move and began on Tuesday. The pain is in the center of her chest but confirms it is not present at rest. Has the patient traveled out of the country within the last 30 days? ---Not Applicable Does the patient have any  new or worsening symptoms? ---Yes Will a triage be completed? ---Yes Related visit to physician within the last 2 weeks? ---No Does the PT have any chronic conditions? (i.e. diabetes, asthma, etc.) ---No Did the patient indicate they were pregnant? ---No Guidelines Guideline Title Affirmed Question Affirmed Notes Nurse Date/Time Lamount Cohen(Eastern Time) Chest Pain Chest pain(s) lasting a few seconds (all triage questions negative) Renaldo FiddlerAdkins, RN, Raynelle FanningJulie 12/19/2014 8:33:56 AM Disp. Time Lamount Cohen(Eastern Time) Disposition Final User 12/19/2014 8:24:17 AM Send to Urgent Tenna ChildQueue Howe, Kim 12/19/2014 8:37:15 AM See Physician within 24 Hours Yes Renaldo FiddlerAdkins, RN, Raynelle FanningJulie Disposition Overriden: Home Care Override Reason: Patient's symptoms need a higher level of care Caller Understands: Yes PLEASE NOTE: All timestamps contained within this report are represented as [place] Standard Time. CONFIDENTIALTY NOTICE: This fax transmission is intended only for the addressee. It contains information that is legally privileged, confidential or otherwise protected from use or disclosure. If you are not the intended recipient, you are strictly prohibited from reviewing, disclosing, copying using or disseminating any of this information or taking any action in reliance on or regarding this information. If you have received this fax in error, please notify us immediately by telephone so that we can arrange for its return to us. Phone: [phone], Toll-Free: [phone], Fax: [phone] Page: 2 of 2 Call Id: [id number] Disagree/Comply: Comply Care Advice Given Per Guideline HOME CARE: You should be able to treat this at home. REASSURANCE: Fleeting chest pains that last only a few seconds and then go away are usually not serious. They may be from the muscles, nerves or joints of your chest wall. ACETAMINOPHEN (E.G., TYLENOL): * For pain relief, take acetaminophen, ibuprofen, or naproxen. IBUPROFEN (E.G., MOTRIN, ADVIL): NAPROXEN (E.G.,  ALEVE): EXPECTED COURSE: You should see a doctor within 1 week  if there is no improvement in this pain. * Chest pain increases in frequency, duration or severity * Chest pain lasts over 5 minutes * Chest pains persist over 3 days * Difficulty breathing or unusual sweating occurs * Fever over 100.5 F (38.1 C) * You become worse. CARE ADVICE given per Chest Pain (Adult) guideline. SEE PHYSICIAN WITHIN 24 HOURS: * Difficulty breathing occurs * Chest pain increases in frequency, duration or severity * Chest pain lasts over 5 minutes * You become worse. CARE ADVICE given per Chest Pain (Adult) guideline. * For pain and fever relief, take acetaminophen or ibuprofen. * You become worse. After Care Instructions Given Call Event Type User Date / Time Description

## 2014-12-19 NOTE — Telephone Encounter (Signed)
[facility] Client TELEPHONE ADVICE RECORD   [facility]     Patient Name: Diane Suarez Initial Comment Caller states she is having chest pains.  DOB: 08/14/1985      Nurse Assessment  Nurse: Renaldo FiddlerAdkins, RN, Raynelle FanningJulie Date/Time Lamount Cohen(Eastern Time): 12/19/2014 8:28:16 AM  Confirm and document reason for call. If symptomatic, describe symptoms. ---Caller states she is having chest pain, it hurts to move and began on Tuesday. The pain is in the center of her chest but confirms it is not present at rest.   Has the patient traveled out of the country within the last 30 days? ---Not Applicable   Does the patient have any new or worsening symptoms? ---Yes   Will a triage be completed? ---Yes   Related visit to physician within the last 2 weeks? ---No   Does the PT have any chronic conditions? (i.e. diabetes, asthma, etc.) ---No   Did the patient indicate they were pregnant? ---No     Guidelines     Guideline Title Affirmed Question Affirmed Notes   Chest Pain Chest pain(s) lasting a few seconds (all triage questions negative)    Final Disposition User   See Physician within 24 Hours Seventh MountainAdkins, Charity fundraiserN, Raynelle FanningJulie

## 2015-02-08 ENCOUNTER — Telehealth: Payer: Self-pay | Admitting: Internal Medicine

## 2015-02-08 NOTE — Telephone Encounter (Signed)
Okay---that sounds fine 

## 2015-02-08 NOTE — Telephone Encounter (Signed)
[facility] - Client TELEPHONE ADVICE RECORD [facility]  Patient Name: Diane Suarez  DOB: 10/03/1985    Initial Comment Caller states she has been having headaches. Pain going down her neck to her back, right side.   Nurse Assessment  Nurse: Laural BenesJohnson, RN, Dondra SpryGail Date/Time Lamount Cohen(Eastern Time): 02/08/2015 8:54:06 AM  Confirm and document reason for call. If symptomatic, describe symptoms. ---[name] has had a headache last week and has one this am has sharp pain that goes from neck to back of head -- took muscle relaxant did ease the pain  Has the patient traveled out of the country within the last 30 days? ---No  Does the patient have any new or worsening symptoms? ---Yes  Will a triage be completed? ---Yes  Related visit to physician within the last 2 weeks? ---No  Does the PT have any chronic conditions? (i.e. diabetes, asthma, etc.) ---No  Did the patient indicate they were pregnant? ---No  Is this a behavioral health or substance abuse call? ---No     Guidelines    Guideline Title Affirmed Question Affirmed Notes  Headache [1] SEVERE headache (e.g., excruciating) AND [2] not improved after 2 hours of pain medicine    Final Disposition User   See Physician within 4 Hours (or PCP triage) Laural BenesJohnson, RN, Dondra SpryGail    Comments  NOTE; Attempted to make appt today none available did not want to go to another office states she will [facility] will call for appt tomorrow   Referrals  [facility]   Disagree/Comply: Comply

## 2015-02-08 NOTE — Telephone Encounter (Signed)
Find out if she needs more muscle relaxer--- which one? I will refill prn

## 2015-02-08 NOTE — Telephone Encounter (Signed)
Spoke with patient and she's taking the Excedrin extra strength tablets, not a muscle relaxer, she will continue to take that and call if anything changes.

## 2015-06-24 ENCOUNTER — Encounter: Payer: Commercial Managed Care - PPO | Admitting: Internal Medicine

## 2015-07-08 ENCOUNTER — Encounter: Payer: Self-pay | Admitting: Internal Medicine

## 2015-07-08 ENCOUNTER — Other Ambulatory Visit (HOSPITAL_COMMUNITY)
Admission: RE | Admit: 2015-07-08 | Discharge: 2015-07-08 | Disposition: A | Discharge status: Home / Self Care | Payer: Commercial Managed Care - PPO | Source: Ambulatory Visit | Attending: Internal Medicine | Admitting: Internal Medicine

## 2015-07-08 ENCOUNTER — Ambulatory Visit (INDEPENDENT_AMBULATORY_CARE_PROVIDER_SITE_OTHER): Payer: Commercial Managed Care - PPO | Admitting: Internal Medicine

## 2015-07-08 VITALS — BP 92/60 | HR 60 | Temp 98.2°F | Ht 65.5 in | Wt 117.0 lb

## 2015-07-08 DIAGNOSIS — Z1151 Encounter for screening for human papillomavirus (HPV): Secondary | ICD-10-CM | POA: Diagnosis present

## 2015-07-08 DIAGNOSIS — Z0000 Encounter for general adult medical examination without abnormal findings: Secondary | ICD-10-CM

## 2015-07-08 DIAGNOSIS — Z01419 Encounter for gynecological examination (general) (routine) without abnormal findings: Secondary | ICD-10-CM | POA: Insufficient documentation

## 2015-07-08 DIAGNOSIS — R87810 Cervical high risk human papillomavirus (HPV) DNA test positive: Secondary | ICD-10-CM | POA: Diagnosis not present

## 2015-07-08 DIAGNOSIS — Z113 Encounter for screening for infections with a predominantly sexual mode of transmission: Secondary | ICD-10-CM | POA: Insufficient documentation

## 2015-07-08 NOTE — Progress Notes (Signed)
Subjective:    Patient ID: Diane Suarez, female    DOB: 06/21/1985, 30 y.o.   MRN: [id number]  HPI Here for physical  Doing well Changed jobs---now working at [employer] doing the same work Now single--- no new partners Stopped the depoprovera  No new concerns  No current outpatient prescriptions on file prior to visit.   No current facility-administered medications on file prior to visit.    No Known Allergies  No past medical history on file.  Past Surgical History  Procedure Laterality Date  . Dilation and curettage of uterus      for TOP    Family History  Problem Relation Age of Onset  . Hypertension Sister   . Diabetes Maternal Grandmother     Social History   Social History  . Marital Status: Single    Spouse Name: N/A  . Number of Children: 1  . Years of Education: N/A   Occupational History  . Billing/operations  [employer]   Social History Main Topics  . Smoking status: Never Smoker   . Smokeless tobacco: Never Used  . Alcohol Use: Yes     Comment: occassionally  . Drug Use: No  . Sexual Activity: Yes    Birth Control/ Protection: IUD   Other Topics Concern  . Not on file   Social History Narrative   Review of Systems  Constitutional: Negative for fatigue and unexpected weight change.       Wears seat belt  HENT: Negative for dental problem, hearing loss and tinnitus.        Keeps up with dentist  Eyes: Negative for visual disturbance.       No diplopia or unilateral vision loss  Respiratory: Negative for cough, chest tightness and shortness of breath.   Cardiovascular: Negative for chest pain, palpitations and leg swelling.  Gastrointestinal: Negative for nausea, abdominal pain, constipation and blood in stool.  Endocrine: Negative for polydipsia and polyuria.  Genitourinary: Negative for hematuria and dyspareunia.       Periods are back to normal  Musculoskeletal: Negative for back pain, joint swelling and arthralgias.  Skin:       Mild acne-- uses OTC soap  Allergic/Immunologic: Negative for environmental allergies and immunocompromised state.  Neurological: Negative for dizziness, syncope, weakness, light-headedness and headaches.  Hematological: Negative for adenopathy. Does not bruise/bleed easily.  Psychiatric/Behavioral: Negative for sleep disturbance and dysphoric mood. The patient is not nervous/anxious.        Objective:   Physical Exam  Constitutional: She is oriented to person, place, and time. She appears well-developed and well-nourished. No distress.  HENT:  Head: Normocephalic and atraumatic.  Right Ear: External ear normal.  Left Ear: External ear normal.  Mouth/Throat: Oropharynx is clear and moist. No oropharyngeal exudate.  Eyes: Conjunctivae are normal. Pupils are equal, round, and reactive to light.  Neck: Normal range of motion. Neck supple. No thyromegaly present.  Cardiovascular: Normal rate, regular rhythm, normal heart sounds and intact distal pulses.  Exam reveals no gallop.   No murmur heard. Pulmonary/Chest: Effort normal and breath sounds normal. No respiratory distress. She has no wheezes. She has no rales.  Abdominal: Soft. There is no tenderness.  Genitourinary:  Pap done Cervix normal --mild cervical metaplasia Bimanual deferred after discussion  Musculoskeletal: She exhibits no edema or tenderness.  Lymphadenopathy:    She has no cervical adenopathy.  Neurological: She is alert and oriented to person, place, and time.  Skin: No rash noted. No  erythema.  Psychiatric: She has a normal mood and affect. Her behavior is normal.          Assessment & Plan:

## 2015-07-08 NOTE — Progress Notes (Signed)
Pre visit review using our clinic review tool, if applicable. No additional management support is needed unless otherwise documented below in the visit note. 

## 2015-07-08 NOTE — Assessment & Plan Note (Signed)
Healthy No longer on depo so doesn't need regular exams Discussed condoms, etc Defer blood work for now

## 2015-07-08 NOTE — Addendum Note (Signed)
Addended by: Eual FinesBRIDGES, SHANNON P on: 07/08/2015 04:41 PM   Modules accepted: Orders

## 2015-07-11 LAB — CYTOLOGY - PAP

## 2015-07-15 ENCOUNTER — Telehealth: Payer: Self-pay

## 2015-07-15 DIAGNOSIS — Z202 Contact with and (suspected) exposure to infections with a predominantly sexual mode of transmission: Secondary | ICD-10-CM

## 2015-07-15 MED ORDER — AZITHROMYCIN 500 MG PO TABS: 1000.0000 mg | ORAL_TABLET | Freq: Every day | ORAL | Status: DC

## 2015-07-15 NOTE — Telephone Encounter (Signed)
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 Pt had annual exam on 07/08/15. Pt has no issues or symptoms vaginally.[facility] [address]

## 2015-07-15 NOTE — Telephone Encounter (Signed)
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

## 2015-07-16 NOTE — Telephone Encounter (Signed)
I have filled out the fax form and faxed it to the [facility].

## 2015-08-13 ENCOUNTER — Telehealth: Payer: Self-pay | Admitting: *Deleted

## 2015-08-13 NOTE — Telephone Encounter (Signed)
Patient called stating that she wants to start back on birth control pills. Patient stated that the ones that she took previously caused a lot of acne and she wants one that will not do that. [facility]

## 2015-08-13 NOTE — Telephone Encounter (Signed)
Spoke to pt. She said Dianah FieldYaz was fine with her. She was asking when she should start taking the pills. I told her I thought it was usually the Sunday after her cycle. She said she just started today. When do you want her to start taking the pills?

## 2015-08-13 NOTE — Telephone Encounter (Signed)
Check with her which one was not good for the acne She was on Yaz a few years ago--which is supposed to be one of the best for acne. If that was okay--can send Rx for generic for 1 year

## 2015-08-14 MED ORDER — DROSPIRENONE-ETHINYL ESTRADIOL 3-0.02 MG PO TABS: 1.0000 | ORAL_TABLET | Freq: Every day | ORAL | Status: DC

## 2015-08-14 NOTE — Telephone Encounter (Signed)
Yes, start Sunday Send the Rx

## 2015-08-14 NOTE — Telephone Encounter (Signed)
Spoke to pt. Sent new rx. 

## 2015-08-21 ENCOUNTER — Other Ambulatory Visit (INDEPENDENT_AMBULATORY_CARE_PROVIDER_SITE_OTHER): Payer: Commercial Managed Care - PPO

## 2015-08-21 DIAGNOSIS — Z202 Contact with and (suspected) exposure to infections with a predominantly sexual mode of transmission: Secondary | ICD-10-CM | POA: Diagnosis not present

## 2015-08-22 LAB — HIV ANTIBODY (ROUTINE TESTING W REFLEX): HIV 1&2 Ab, 4th Generation: NONREACTIVE

## 2015-08-22 LAB — RPR

## 2016-03-04 ENCOUNTER — Telehealth: Payer: Self-pay | Admitting: Internal Medicine

## 2016-03-04 NOTE — Telephone Encounter (Signed)
PLEASE NOTE: All timestamps contained within this report are represented as [place] Standard Time. CONFIDENTIALTY NOTICE: This fax transmission is intended only for the addressee. It contains information that is legally privileged, confidential or otherwise protected from use or disclosure. If you are not the intended recipient, you are strictly prohibited from reviewing, disclosing, copying using or disseminating any of this information or taking any action in reliance on or regarding this information. If you have received this fax in error, please notify us immediately by telephone so that we can arrange for its return to us. Phone: [phone], Toll-Free: [phone], Fax: [phone] Page: 1 of 2 Call Id: [id number] [facility] - Client TELEPHONE ADVICE RECORD [facility] Patient Name: Diane Suarez Gender: Female DOB: 03/29/1985 Age: [age] Y 6 M 20 D Return Phone Number: [phone] (Primary) Address: City/State/Zip: [place] Client [facility] - Client Client Site [facility] Day Physician Tillman AbideLetvak, Richard - MD Contact Type Call Who Is Calling Patient / Member / Family / Caregiver Call Type Triage / Clinical Relationship To Patient Self Return Phone Number [phone] (Primary) Chief Complaint Rash - Localized Reason for Call Symptomatic / Request for Health Information Initial Comment Caller has a bump in her groin area that is painfull. Appointment Disposition EMR Appointment Not Necessary Info pasted into Epic Yes PreDisposition Go to ED Translation No Nurse Assessment Nurse: Annye Englisharmon, RN, Angelique Blonderenise Date/Time (Eastern Time): 03/04/2016 2:22:09 PM Confirm and document reason for call. If symptomatic, describe symptoms. ---Caller has a bump in her groin area that is painful on the outside of the right labia. No drainage or fever, but the site is red and swollen. Does the patient have any  new or worsening symptoms? ---Yes Will a triage be completed? ---Yes Related visit to physician within the last 2 weeks? ---No Does the PT have any chronic conditions? (i.e. diabetes, asthma, etc.) ---No Is the patient pregnant or possibly pregnant? (Ask all females between the ages of 3112-55) ---No Is this a behavioral health or substance abuse call? ---No Guidelines Guideline Title Affirmed Question Affirmed Notes Nurse Date/Time (Eastern Time) Boil (Skin Abscess) Boil < 1/2 inch across (< 12 mm; smaller than a marble) (all triage questions negative) Carmon, RN, Angelique Blonderenise 03/04/2016 2:24:09 PM Disp. Time Lamount Cohen(Eastern Time) Disposition Final User 03/04/2016 2:35:01 PM Call Completed Annye EnglishCarmon, RN, Angelique Blonderenise 03/04/2016 2:32:52 PM Home Care Yes Carmon, RN, Angelique Blonderenise PLEASE NOTE: All timestamps contained within this report are represented as [place] Standard Time. CONFIDENTIALTY NOTICE: This fax transmission is intended only for the addressee. It contains information that is legally privileged, confidential or otherwise protected from use or disclosure. If you are not the intended recipient, you are strictly prohibited from reviewing, disclosing, copying using or disseminating any of this information or taking any action in reliance on or regarding this information. If you have received this fax in error, please notify us immediately by telephone so that we can arrange for its return to us. Phone: [phone], Toll-Free: [phone], Fax: [phone] Page: 2 of 2 Call Id: [id number] Caller Understands: Yes Disagree/Comply: Comply Care Advice Given Per Guideline HOME CARE: You should be able to treat this at home. REASSURANCE: * A boil is an infection of a hair follicle. There is usually pain, redness, and a small area of swelling. * Small boils can usually be treated home. TREATMENT FOR A BOIL - GENERAL: * Do not squeeze a boil (Reason: This can push bacteria deeper into the  skin). * Avoid touching or  scratching the boil. * Keep your hands clean. Wash them with soap and water. TREATMENT FOR A BOIL - APPLY MOIST HEAT: * Heat can help bring the boil 'to a head' so that it can open and the pus can drain out. * Apply a warm, wet washcloth to the boil for 15 minutes 3 times a day. * If the boil drains pus: continue to apply a warm wet washcloth to the boil 3 times a day for three more days. TREATMENT FOR A BOIL - APPLY ANTIBIOTIC OINTMENT: Apply an over-the-counter antibiotic ointment (e.g., Bacitracin) to the area of redness three times daily. DRAINING PUS - IT IS CONTAGIOUS: Pus or other drainage from an open boil is very contagious (you can spread it to others). DRAINING PUS - PREVENTING SPREAD TO YOURSELF AND OTHERS: * Wash the boil with soap and water each day. * Launder any clothes, sheets, and towels that become contaminated with drainage. * Use a clean towel daily. CALL BACK IF: * Severe pain or fever occurs * Widespread rash occurs * Redness spreads beyond the boil * Boil becomes over 2 inches (5 cm) across * No improvement after 3 days * You become worse. CARE ADVICE per Boil and Abscess (Adult) guideline. * PUS DRAINS SPONTANEOUSLY: Sometimes the body walls off the Staph infection and the center of the boil becomes soft (filled with pus). The overlying skin then develops a pimple or becomes pus-colored ('comes to a head'). The pus may suddenly drain by itself; you may need to apply moist heat for 3-4 days before this occurs. Once a boil opens it will drain pus for 2 to 3 days and then should heal up.

## 2016-03-04 NOTE — Telephone Encounter (Signed)
Patient Name: Dorthula RueANYAE Diana  DOB: 12/02/1985    Initial Comment Caller has a bump in her groin area that is painfull.    Nurse Assessment  Nurse: Annye Englisharmon, RN, Denise Date/Time (Eastern Time): 03/04/2016 2:22:09 PM  Confirm and document reason for call. If symptomatic, describe symptoms. ---Caller has a bump in her groin area that is painful on the outside of the right labia. No drainage or fever, but the site is red and swollen.  Does the patient have any new or worsening symptoms? ---Yes  Will a triage be completed? ---Yes  Related visit to physician within the last 2 weeks? ---No  Does the PT have any chronic conditions? (i.e. diabetes, asthma, etc.) ---No  Is the patient pregnant or possibly pregnant? (Ask all females between the ages of 7612-55) ---No  Is this a behavioral health or substance abuse call? ---No     Guidelines    Guideline Title Affirmed Question Affirmed Notes  Boil (Skin Abscess) Boil < 1/2 inch across (< 12 mm; smaller than a marble) (all triage questions negative)    Final Disposition User   Home Care Giddingsarmon, RN, Angelique Blonderenise    Disagree/Comply: Comply

## 2016-03-05 NOTE — Telephone Encounter (Signed)
Please check on her today Not sure this should have been triaged to ED---[facility] her

## 2016-03-05 NOTE — Telephone Encounter (Signed)
Left message to call office. I am not really sure she was advised to go to the ER. I think the [facility] person selected the wrong thing. It said no appt necessary but then said ED. I will find out from the pt if she calls back.

## 2016-04-22 ENCOUNTER — Telehealth: Payer: Self-pay | Admitting: Internal Medicine

## 2016-04-22 NOTE — Telephone Encounter (Signed)
Pt faxed over ppw yesterday. She needs them completed by Friday and would like to pick them up. Pt is also asking if you can call he when they are complete Thank you

## 2016-04-22 NOTE — Telephone Encounter (Signed)
I do not usually receive forms like this. They usually go straight to Dr Alphonsus SiasLetvak then to [place] to send off.   Lyla SonCarrie, have you seen any forms for this patient?

## 2016-04-22 NOTE — Telephone Encounter (Signed)
I don't recall the paperwork,but I receive several things back.  Dr.Letvak do you remember seeing this paperwork?

## 2016-04-22 NOTE — Telephone Encounter (Signed)
I don't remember seeing any forms for her. She better fax them again

## 2016-04-24 ENCOUNTER — Telehealth: Payer: Self-pay | Admitting: Internal Medicine

## 2016-04-24 NOTE — Telephone Encounter (Signed)
The patient dropped off forms to be completed for Medical Evaluation for [facility]. The papers are place in the Rx tower.

## 2016-04-27 NOTE — Telephone Encounter (Signed)
Spoke to pt. Forms up front ready for pickup. °

## 2016-04-27 NOTE — Telephone Encounter (Signed)
Form done and signed No charge Blank form is for her to do

## 2016-04-27 NOTE — Telephone Encounter (Signed)
Form placed in Dr Letvak's inbox on his desk 

## 2016-07-09 ENCOUNTER — Ambulatory Visit (INDEPENDENT_AMBULATORY_CARE_PROVIDER_SITE_OTHER): Payer: Commercial Managed Care - PPO | Admitting: Internal Medicine

## 2016-07-09 ENCOUNTER — Encounter: Payer: Self-pay | Admitting: Internal Medicine

## 2016-07-09 VITALS — BP 94/60 | HR 65 | Temp 98.0°F | Wt 114.0 lb

## 2016-07-09 DIAGNOSIS — Z0000 Encounter for general adult medical examination without abnormal findings: Secondary | ICD-10-CM | POA: Diagnosis not present

## 2016-07-09 MED ORDER — ESTRADIOL VALERATE-DIENOGEST 3/2-2/2-3/1 MG PO TABS: 1.0000 | ORAL_TABLET | Freq: Every day | ORAL | 11 refills | Status: DC

## 2016-07-09 NOTE — Addendum Note (Signed)
Addended by: Alvina ChouWALSH, Jacobi Ryant J on: 07/09/2016 03:22 PM   Modules accepted: Orders

## 2016-07-09 NOTE — Progress Notes (Signed)
Pre visit review using our clinic review tool, if applicable. No additional management support is needed unless otherwise documented below in the visit note. 

## 2016-07-09 NOTE — Assessment & Plan Note (Signed)
Healthy Discussed exercise Flu vaccine at work Safe sex discussion (one partner--- condoms)

## 2016-07-09 NOTE — Progress Notes (Signed)
Subjective:    Patient ID: Diane Suarez, female    DOB: 09/25/1985, 31 y.o.   MRN: [id number]  HPI Here for physical  Having trouble with the OCP Doesn't help the acne Cycles are regular on this though Condoms with all intercourse---single partner  Concerned about acne Got creams from dermatologist but wonders about changing the OCP Will give higher dose of estrogen but on maximal progesterone  Current Outpatient Prescriptions on File Prior to Visit  Medication Sig Dispense Refill  . [DISCONTINUED] drospirenone-ethinyl estradiol (YAZ) 3-0.02 MG tablet Take 1 tablet by mouth daily. 1 Package 11   No current facility-administered medications on file prior to visit.     No Known Allergies  No past medical history on file.  Past Surgical History:  Procedure Laterality Date  . DILATION AND CURETTAGE OF UTERUS     for TOP    Family History  Problem Relation Age of Onset  . Hypertension Sister   . Diabetes Maternal Grandmother     Social History   Social History  . Marital status: Single    Spouse name: N/A  . Number of children: 1  . Years of education: N/A   Occupational History  . Billing/operations  [employer]   Social History Main Topics  . Smoking status: Never Smoker  . Smokeless tobacco: Never Used  . Alcohol use Yes     Comment: occassionally  . Drug use: No  . Sexual activity: Yes    Birth control/ protection: IUD   Other Topics Concern  . Not on file   Social History Narrative  . No narrative on file   Review of Systems  Constitutional: Negative for unexpected weight change.       No exercise Wears seat belt  HENT: Negative for dental problem, hearing loss, tinnitus and trouble swallowing.        Sees dentist  Eyes: Negative for visual disturbance.       No diplopia or unilateral vision loss  Respiratory: Negative for cough, chest tightness and shortness of breath.   Cardiovascular: Negative for chest pain, palpitations and leg  swelling.  Gastrointestinal: Negative for blood in stool, constipation and nausea.       No heartburn  Endocrine: Negative for polydipsia and polyuria.  Genitourinary: Negative for dyspareunia, dysuria and hematuria.  Musculoskeletal: Negative for arthralgias, back pain and joint swelling.  Skin: Negative for rash.  Allergic/Immunologic: Negative for environmental allergies and immunocompromised state.  Neurological: Negative for dizziness, syncope, light-headedness and headaches.  Psychiatric/Behavioral: Negative for dysphoric mood and sleep disturbance. The patient is not nervous/anxious.        Objective:   Physical Exam  Constitutional: She appears well-developed and well-nourished. No distress.  HENT:  Head: Normocephalic and atraumatic.  Right Ear: External ear normal.  Left Ear: External ear normal.  Mouth/Throat: Oropharynx is clear and moist. No oropharyngeal exudate.  Eyes: Conjunctivae are normal. Pupils are equal, round, and reactive to light.  Neck: Normal range of motion. Neck supple. No thyromegaly present.  Cardiovascular: Normal rate, regular rhythm, normal heart sounds and intact distal pulses.  Exam reveals no gallop.   No murmur heard. Pulmonary/Chest: Effort normal and breath sounds normal. No respiratory distress. She has no wheezes. She has no rales.  Abdominal: Soft. There is no tenderness.  Musculoskeletal: She exhibits no edema or tenderness.  Lymphadenopathy:    She has no cervical adenopathy.  Skin: No erythema.  Psychiatric: She has a normal mood and affect.  Her behavior is normal.          Assessment & Plan:

## 2016-07-10 LAB — GC/CHLAMYDIA PROBE AMP
CT Probe RNA: NOT DETECTED
GC Probe RNA: NOT DETECTED

## 2017-08-06 ENCOUNTER — Encounter: Payer: Self-pay | Admitting: Internal Medicine

## 2017-08-06 ENCOUNTER — Ambulatory Visit (INDEPENDENT_AMBULATORY_CARE_PROVIDER_SITE_OTHER): Payer: Commercial Managed Care - PPO | Admitting: Internal Medicine

## 2017-08-06 VITALS — BP 114/78 | HR 78 | Temp 99.0°F | Ht 65.0 in | Wt 130.0 lb

## 2017-08-06 DIAGNOSIS — Z0000 Encounter for general adult medical examination without abnormal findings: Secondary | ICD-10-CM

## 2017-08-06 NOTE — Assessment & Plan Note (Signed)
Healthy Discussed starting exercise Will need pap next year Recommended flu vaccine

## 2017-08-06 NOTE — Progress Notes (Signed)
Subjective:    Patient ID: Diane Suarez, female    DOB: 1986-01-24, 32 y.o.   MRN: [id number]  HPI Here for physical  No new concerns Stopped the OCP--wasn't tolerating Stable relationship---using condoms for now Considering having kids --but not ready  Current Outpatient Medications on File Prior to Visit  Medication Sig Dispense Refill  . CRANBERRY EXTRACT PO Take by mouth.    . Multiple Vitamin (MULTIVITAMIN) capsule Take 1 capsule by mouth daily.    . Multiple Vitamins-Minerals (HAIR SKIN & NAILS ADVANCED PO) Take by mouth.    . [DISCONTINUED] drospirenone-ethinyl estradiol (YAZ) 3-0.02 MG tablet Take 1 tablet by mouth daily. 1 Package 11   No current facility-administered medications on file prior to visit.     No Known Allergies  History reviewed. No pertinent past medical history.  Past Surgical History:  Procedure Laterality Date  . DILATION AND CURETTAGE OF UTERUS     for TOP    Family History  Problem Relation Age of Onset  . Hypertension Sister   . Diabetes Maternal Grandmother     Social History   Socioeconomic History  . Marital status: Single    Spouse name: Not on file  . Number of children: 1  . Years of education: Not on file  . Highest education level: Not on file  Occupational History  . Occupation: Billing/operations     Employer: [employer]  Social Needs  . Financial resource strain: Not on file  . Food insecurity:    Worry: Not on file    Inability: Not on file  . Transportation needs:    Medical: Not on file    Non-medical: Not on file  Tobacco Use  . Smoking status: Never Smoker  . Smokeless tobacco: Never Used  Substance and Sexual Activity  . Alcohol use: Yes    Comment: occassionally  . Drug use: No  . Sexual activity: Yes    Birth control/protection: IUD  Lifestyle  . Physical activity:    Days per week: Not on file    Minutes per session: Not on file  . Stress: Not on file  Relationships  . Social  connections:    Talks on phone: Not on file    Gets together: Not on file    Attends religious service: Not on file    Active member of club or organization: Not on file    Attends meetings of clubs or organizations: Not on file    Relationship status: Not on file  . Intimate partner violence:    Fear of current or ex partner: Not on file    Emotionally abused: Not on file    Physically abused: Not on file    Forced sexual activity: Not on file  Other Topics Concern  . Not on file  Social History Narrative  . Not on file   Review of Systems  Constitutional: Negative for fatigue.       Eating more Weight up --- "happy weight" Needs to exercise Wears seat belt  HENT: Negative for dental problem, hearing loss, tinnitus and trouble swallowing.        Keeps up with dentist  Eyes: Negative for visual disturbance.       No diplopia or unilateral vision loss  Respiratory: Negative for cough, chest tightness and shortness of breath.   Cardiovascular: Negative for chest pain, palpitations and leg swelling.  Gastrointestinal: Negative for abdominal pain and constipation.       Chronic  mild constipation No heartburn  Endocrine: Negative for polydipsia and polyuria.  Genitourinary: Negative for dyspareunia, dysuria and hematuria.       Periods regular  Musculoskeletal: Negative for arthralgias, back pain and joint swelling.       Mild wrist pain with prolonged computer use at work  Skin: Negative for rash.  Allergic/Immunologic: Negative for environmental allergies and immunocompromised state.  Neurological: Negative for dizziness, syncope, light-headedness and headaches.  Hematological: Negative for adenopathy. Does not bruise/bleed easily.  Psychiatric/Behavioral: Negative for dysphoric mood and sleep disturbance. The patient is not nervous/anxious.        Objective:   Physical Exam  Constitutional: She is oriented to person, place, and time. She appears well-developed. No  distress.  HENT:  Head: Normocephalic and atraumatic.  Right Ear: External ear normal.  Left Ear: External ear normal.  Mouth/Throat: Oropharynx is clear and moist. No oropharyngeal exudate.  Eyes: Pupils are equal, round, and reactive to light. Conjunctivae are normal.  Neck: No thyromegaly present.  Cardiovascular: Normal rate, regular rhythm, normal heart sounds and intact distal pulses. Exam reveals no gallop.  No murmur heard. Respiratory: Effort normal and breath sounds normal. No respiratory distress. She has no wheezes. She has no rales.  GI: Soft. There is no tenderness.  Musculoskeletal: She exhibits no edema or tenderness.  Lymphadenopathy:    She has no cervical adenopathy.  Neurological: She is alert and oriented to person, place, and time.  Skin: No rash noted. No erythema.  Psychiatric: She has a normal mood and affect. Her behavior is normal.           Assessment & Plan:

## 2017-11-30 ENCOUNTER — Ambulatory Visit: Payer: Commercial Managed Care - PPO | Admitting: Internal Medicine

## 2018-01-13 NOTE — Telephone Encounter (Signed)
Please check with her first thing in the morning to see if she wants one of my same days

## 2018-01-14 ENCOUNTER — Encounter: Payer: Self-pay | Admitting: Internal Medicine

## 2018-01-14 ENCOUNTER — Ambulatory Visit (INDEPENDENT_AMBULATORY_CARE_PROVIDER_SITE_OTHER): Payer: Commercial Managed Care - PPO | Admitting: Internal Medicine

## 2018-01-14 VITALS — BP 118/82 | HR 107 | Temp 99.1°F | Ht 65.0 in | Wt 128.0 lb

## 2018-01-14 DIAGNOSIS — H1031 Unspecified acute conjunctivitis, right eye: Secondary | ICD-10-CM | POA: Diagnosis not present

## 2018-01-14 DIAGNOSIS — H109 Unspecified conjunctivitis: Secondary | ICD-10-CM | POA: Insufficient documentation

## 2018-01-14 MED ORDER — SULFACETAMIDE SODIUM 10 % OP SOLN: 2.0000 [drp] | OPHTHALMIC | 0 refills | Status: DC

## 2018-01-14 NOTE — Assessment & Plan Note (Addendum)
Likely viral (has sinus symptoms as well) Supportive care If worsens, will try sulamyd Consider antibiotic for sinus if worse next week

## 2018-01-14 NOTE — Patient Instructions (Signed)
Start the antibiotic drops if your eye gets worse.

## 2018-01-14 NOTE — Progress Notes (Signed)
Subjective:    Patient ID: Diane Suarez, female    DOB: 09/09/1985, 32 y.o.   MRN: [id number]  HPI Here due to eye redness  Right eye since yesterday morning No discharge Having cold and sinus symptoms Maxillary pain No fever at home Slight chills  No trauma to eye No exposure to pink eye  Current Outpatient Medications on File Prior to Visit  Medication Sig Dispense Refill  . CRANBERRY EXTRACT PO Take by mouth.    . Multiple Vitamin (MULTIVITAMIN) capsule Take 1 capsule by mouth daily.    . Multiple Vitamins-Minerals (HAIR SKIN & NAILS ADVANCED PO) Take by mouth.    . [DISCONTINUED] drospirenone-ethinyl estradiol (YAZ) 3-0.02 MG tablet Take 1 tablet by mouth daily. 1 Package 11   No current facility-administered medications on file prior to visit.     No Known Allergies  History reviewed. No pertinent past medical history.  Past Surgical History:  Procedure Laterality Date  . DILATION AND CURETTAGE OF UTERUS     for TOP    Family History  Problem Relation Age of Onset  . Hypertension Sister   . Diabetes Maternal Grandmother     Social History   Socioeconomic History  . Marital status: Single    Spouse name: Not on file  . Number of children: 1  . Years of education: Not on file  . Highest education level: Not on file  Occupational History  . Occupation: Billing/operations     Employer: [employer]  Social Needs  . Financial resource strain: Not on file  . Food insecurity:    Worry: Not on file    Inability: Not on file  . Transportation needs:    Medical: Not on file    Non-medical: Not on file  Tobacco Use  . Smoking status: Never Smoker  . Smokeless tobacco: Never Used  Substance and Sexual Activity  . Alcohol use: Yes    Comment: occassionally  . Drug use: No  . Sexual activity: Yes    Birth control/protection: IUD  Lifestyle  . Physical activity:    Days per week: Not on file    Minutes per session: Not on file  . Stress: Not on  file  Relationships  . Social connections:    Talks on phone: Not on file    Gets together: Not on file    Attends religious service: Not on file    Active member of club or organization: Not on file    Attends meetings of clubs or organizations: Not on file    Relationship status: Not on file  . Intimate partner violence:    Fear of current or ex partner: Not on file    Emotionally abused: Not on file    Physically abused: Not on file    Forced sexual activity: Not on file  Other Topics Concern  . Not on file  Social History Narrative  . Not on file   Review of Systems No cough  Some sore throat Cold symptoms go back ~3 days    Objective:   Physical Exam  Constitutional: She appears well-developed. No distress.  HENT:  No sinus tenderness TMs normal Slight pharyngeal injection Moderate nasal inflammation  Eyes:  Mild conjunctival injection on right  No foreign body or masses  Neck: No thyromegaly present.  Respiratory: Effort normal and breath sounds normal. No respiratory distress. She has no wheezes. She has no rales.  Lymphadenopathy:    She has no cervical  adenopathy.           Assessment & Plan:

## 2018-08-10 ENCOUNTER — Encounter: Payer: Self-pay | Admitting: Internal Medicine

## 2018-08-10 ENCOUNTER — Other Ambulatory Visit: Payer: Self-pay

## 2018-08-10 ENCOUNTER — Ambulatory Visit (INDEPENDENT_AMBULATORY_CARE_PROVIDER_SITE_OTHER): Payer: Commercial Managed Care - PPO | Admitting: Internal Medicine

## 2018-08-10 ENCOUNTER — Other Ambulatory Visit (HOSPITAL_COMMUNITY)
Admission: RE | Admit: 2018-08-10 | Discharge: 2018-08-10 | Disposition: A | Discharge status: Home / Self Care | Payer: Commercial Managed Care - PPO | Source: Ambulatory Visit | Attending: Internal Medicine | Admitting: Internal Medicine

## 2018-08-10 VITALS — BP 114/80 | HR 67 | Temp 98.3°F | Ht 65.0 in | Wt 123.0 lb

## 2018-08-10 DIAGNOSIS — Z0000 Encounter for general adult medical examination without abnormal findings: Secondary | ICD-10-CM

## 2018-08-10 NOTE — Progress Notes (Signed)
Subjective:    Patient ID: Diane Suarez, female    DOB: 11/01/1985, 33 y.o.   MRN: [id number]  HPI Here for PE Is able to work from home---so not unemployed Not really exercising --discussed  Stable relationship No condoms now----seeing if she will conceive (but not actively trying)  Current Outpatient Medications on File Prior to Visit  Medication Sig Dispense Refill  . CRANBERRY EXTRACT PO Take by mouth.    . Multiple Vitamin (MULTIVITAMIN) capsule Take 1 capsule by mouth daily.    . Multiple Vitamins-Minerals (HAIR SKIN & NAILS ADVANCED PO) Take by mouth.    . [DISCONTINUED] drospirenone-ethinyl estradiol (YAZ) 3-0.02 MG tablet Take 1 tablet by mouth daily. 1 Package 11   No current facility-administered medications on file prior to visit.     No Known Allergies  History reviewed. No pertinent past medical history.  Past Surgical History:  Procedure Laterality Date  . DILATION AND CURETTAGE OF UTERUS     for TOP    Family History  Problem Relation Age of Onset  . Hypertension Sister   . Diabetes Maternal Grandmother     Social History   Socioeconomic History  . Marital status: Single    Spouse name: Not on file  . Number of children: 1  . Years of education: Not on file  . Highest education level: Not on file  Occupational History  . 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 Financial resource strain: Not on file  . Food insecurity:    Worry: Not on file    Inability: Not on file  . Transportation needs:    Medical: Not on file    Non-medical: Not on file  Tobacco Use  . Smoking status: Never Smoker  . Smokeless tobacco: Never Used  Substance and Sexual Activity  . Alcohol use: Yes    Comment: occassionally  . Drug use: No  . Sexual activity: Yes    Birth control/protection: I.U.D.  Lifestyle  . Physical activity:    Days per week: Not on file    Minutes per session: Not on file  . Stress: Not on file   Relationships  . Social connections:    Talks on phone: Not on file    Gets together: Not on file    Attends religious service: Not on file    Active member of club or organization: Not on file    Attends meetings of clubs or organizations: Not on file    Relationship status: Not on file  . Intimate partner violence:    Fear of current or ex partner: Not on file    Emotionally abused: Not on file    Physically abused: Not on file    Forced sexual activity: Not on file  Other Topics Concern  . Not on file  Social History Narrative  . Not on file   Review of Systems  Constitutional: Negative for fatigue and unexpected weight change.       Wears seat belt  HENT: Negative for dental problem, hearing loss and tinnitus.   Eyes: Negative for visual disturbance.       No diplopia or unilateral vision loss  Respiratory: Negative for cough, chest tightness and shortness of breath.   Cardiovascular: Negative for chest pain, palpitations and leg swelling.  Gastrointestinal: Negative for abdominal pain and blood in stool.       Usually goes every few days  Endocrine: Negative for polydipsia  and polyuria.  Genitourinary: Negative for difficulty urinating, dyspareunia, dysuria, hematuria and menstrual problem.       Periods regularly  Musculoskeletal: Negative for arthralgias, back pain and joint swelling.  Skin: Negative for rash.  Allergic/Immunologic: Positive for environmental allergies. Negative for immunocompromised state.       Uses benedryl prn  Neurological: Negative for dizziness, syncope, light-headedness and headaches.  Hematological: Negative for adenopathy. Does not bruise/bleed easily.  Psychiatric/Behavioral: Negative for dysphoric mood and sleep disturbance. The patient is not nervous/anxious.        Objective:   Physical Exam  Constitutional: She is oriented to person, place, and time. She appears well-developed. No distress.  HENT:  Head: Normocephalic and  atraumatic.  Right Ear: External ear normal.  Left Ear: External ear normal.  Mouth/Throat: Oropharynx is clear and moist. No oropharyngeal exudate.  Eyes: Pupils are equal, round, and reactive to light. Conjunctivae are normal.  Neck: Normal range of motion. No thyromegaly present.  Cardiovascular: Normal rate, regular rhythm, normal heart sounds and intact distal pulses. Exam reveals no gallop.  No murmur heard. Respiratory: Effort normal and breath sounds normal. No respiratory distress. She has no wheezes. She has no rales.  GI: Soft. There is no abdominal tenderness.  Genitourinary:    Genitourinary Comments: Normal introitus and appearance of cervix Pap done   Musculoskeletal: Normal range of motion.        General: No edema.  Lymphadenopathy:    She has no cervical adenopathy.  Neurological: She is alert and oriented to person, place, and time.  Skin: No rash noted. No erythema.  Psychiatric: She has a normal mood and affect. Her behavior is normal.           Assessment & Plan:

## 2018-08-10 NOTE — Addendum Note (Signed)
Addended by: Eual Fines on: 08/10/2018 10:19 AM   Modules accepted: Orders

## 2018-08-10 NOTE — Assessment & Plan Note (Addendum)
Healthy Counseled on fitness No cancer screening for now Flu vaccine in the fall

## 2018-08-12 LAB — CYTOLOGY - PAP
Diagnosis: NEGATIVE
HPV: NOT DETECTED

## 2018-12-28 ENCOUNTER — Encounter: Payer: Self-pay | Admitting: Internal Medicine

## 2018-12-28 ENCOUNTER — Ambulatory Visit (INDEPENDENT_AMBULATORY_CARE_PROVIDER_SITE_OTHER): Payer: Commercial Managed Care - PPO | Admitting: Internal Medicine

## 2018-12-28 ENCOUNTER — Other Ambulatory Visit: Payer: Self-pay

## 2018-12-28 VITALS — BP 110/76 | HR 86 | Temp 98.3°F | Ht 65.0 in | Wt 118.0 lb

## 2018-12-28 DIAGNOSIS — Z202 Contact with and (suspected) exposure to infections with a predominantly sexual mode of transmission: Secondary | ICD-10-CM | POA: Diagnosis not present

## 2018-12-28 DIAGNOSIS — Z23 Encounter for immunization: Secondary | ICD-10-CM | POA: Diagnosis not present

## 2018-12-28 NOTE — Assessment & Plan Note (Signed)
Low risk  Wants to make sure no problems Discussed condoms with any intercourse

## 2018-12-28 NOTE — Addendum Note (Signed)
Addended by: Pilar Grammes on: 12/28/2018 10:53 AM   Modules accepted: Orders

## 2018-12-28 NOTE — Progress Notes (Signed)
   Subjective:    Patient ID: Diane Suarez, female    DOB: December 13, 1985, 33 y.o.   MRN: [id number]  HPI Here due to concerns about STDs No longer with partner--he has moved out Doesn't think he was unfaithful  Periods are normal No vaginal discharge or sores No fever Not sick  Current Outpatient Medications on File Prior to Visit  Medication Sig Dispense Refill  . CRANBERRY EXTRACT PO Take by mouth.    . Multiple Vitamin (MULTIVITAMIN) capsule Take 1 capsule by mouth daily.    . Multiple Vitamins-Minerals (HAIR SKIN & NAILS ADVANCED PO) Take by mouth.    . [DISCONTINUED] drospirenone-ethinyl estradiol (YAZ) 3-0.02 MG tablet Take 1 tablet by mouth daily. 1 Package 11   No current facility-administered medications on file prior to visit.     No Known Allergies  History reviewed. No pertinent past medical history.  Past Surgical History:  Procedure Laterality Date  . DILATION AND CURETTAGE OF UTERUS     for TOP    Family History  Problem Relation Age of Onset  . Hypertension Sister   . Diabetes Maternal Grandmother     Social History   Socioeconomic History  . Marital status: Single    Spouse name: Not on file  . Number of children: 1  . Years of education: Not on file  . Highest education level: Not on file  Occupational History  . Occupation: Billing/operations     Employer: [employer]  Social Needs  . Financial resource strain: Not on file  . Food insecurity    Worry: Not on file    Inability: Not on file  . Transportation needs    Medical: Not on file    Non-medical: Not on file  Tobacco Use  . Smoking status: Never Smoker  . Smokeless tobacco: Never Used  Substance and Sexual Activity  . Alcohol use: Yes    Comment: occassionally  . Drug use: No  . Sexual activity: Yes    Birth control/protection: I.U.D.  Lifestyle  . Physical activity    Days per week: Not on file    Minutes per session: Not on file  . Stress: Not on file  Relationships   . Social Herbalist on phone: Not on file    Gets together: Not on file    Attends religious service: Not on file    Active member of club or organization: Not on file    Attends meetings of clubs or organizations: Not on file    Relationship status: Not on file  . Intimate partner violence    Fear of current or ex partner: Not on file    Emotionally abused: Not on file    Physically abused: Not on file    Forced sexual activity: Not on file  Other Topics Concern  . Not on file  Social History Narrative  . Not on file   Review of Systems Appetite is okay Job is stable--bored being home all the time Sleeps okay     Objective:   Physical Exam  Constitutional: She appears well-developed. No distress.  Psychiatric: She has a normal mood and affect. Her behavior is normal.           Assessment & Plan:

## 2018-12-29 LAB — HIV ANTIBODY (ROUTINE TESTING W REFLEX): HIV 1&2 Ab, 4th Generation: NONREACTIVE

## 2018-12-29 LAB — C. TRACHOMATIS/N. GONORRHOEAE RNA
C. trachomatis RNA, TMA: NOT DETECTED
N. gonorrhoeae RNA, TMA: NOT DETECTED

## 2018-12-29 LAB — RPR: RPR Ser Ql: NONREACTIVE

## 2019-12-07 ENCOUNTER — Ambulatory Visit (INDEPENDENT_AMBULATORY_CARE_PROVIDER_SITE_OTHER): Payer: Commercial Managed Care - PPO

## 2019-12-07 ENCOUNTER — Other Ambulatory Visit: Payer: Self-pay

## 2019-12-07 DIAGNOSIS — Z23 Encounter for immunization: Secondary | ICD-10-CM

## 2019-12-13 ENCOUNTER — Ambulatory Visit: Payer: Commercial Managed Care - PPO | Admitting: Internal Medicine

## 2019-12-23 ENCOUNTER — Ambulatory Visit
Admission: EM | Admit: 2019-12-23 | Discharge: 2019-12-23 | Disposition: A | Discharge status: Home / Self Care | Payer: Commercial Managed Care - PPO | Attending: Physician Assistant | Admitting: Physician Assistant

## 2019-12-23 ENCOUNTER — Other Ambulatory Visit: Payer: Self-pay

## 2019-12-23 ENCOUNTER — Encounter: Payer: Self-pay | Admitting: Emergency Medicine

## 2019-12-23 DIAGNOSIS — L03011 Cellulitis of right finger: Secondary | ICD-10-CM | POA: Diagnosis not present

## 2019-12-23 MED ORDER — DOXYCYCLINE HYCLATE 100 MG PO CAPS: 100.0000 mg | ORAL_CAPSULE | Freq: Two times a day (BID) | ORAL | 0 refills | Status: AC

## 2019-12-23 MED ORDER — FLUCONAZOLE 150 MG PO TABS: ORAL_TABLET | ORAL | 0 refills | Status: DC

## 2019-12-23 NOTE — ED Triage Notes (Signed)
Patient c/o redness, swelling and pain at her right 3rd fingertip that started yesterday.  Patient denies fevers.

## 2019-12-23 NOTE — ED Provider Notes (Signed)
[facility]    CSN: [id number] Arrival date & time: 12/23/19  1856      History   Chief Complaint Chief Complaint  Patient presents with  . Finger pain    right 3rd    HPI Diane Suarez is a 34 y.o. female presenting for redness and swelling of the right fingertip since yesterday.  She says she laid down and just noticed it.  She says that this is tender and it appears like there is pus present.  She has not done anything for condition has not needed to take anything for pain.  She is pain is about 1 out of 10 unless she touches it and then it is more.  Denies any associated numbness or tingling.  Denies any known injury.  Denies fever.  Has never had anything like this before.  No other complaints or concerns.  HPI  History reviewed. No pertinent past medical history.  Patient Active Problem List   Diagnosis Date Noted  . Conjunctivitis 01/14/2018  . Sternal pain 12/19/2014  . Preventative health care 01/25/2014  . Contraception management 07/03/2013  . Allergic rhinitis 08/26/2012  . Fibrocystic breast changes 03/21/2012  . Exposure to sexually transmitted disease (STD) 07/01/2011    Past Surgical History:  Procedure Laterality Date  . DILATION AND CURETTAGE OF UTERUS     for TOP    OB History   No obstetric history on file.      Home Medications    Prior to Admission medications   Medication Sig Start Date End Date Taking? Authorizing Provider  Multiple Vitamin (MULTIVITAMIN) capsule Take 1 capsule by mouth daily.   Yes [provider]  CRANBERRY EXTRACT PO Take by mouth.    [provider]  doxycycline (VIBRAMYCIN) 100 MG capsule Take 1 capsule (100 mg total) by mouth 2 (two) times daily for 5 days. 12/23/19 12/28/19  Shirlee Latch, PA-C  fluconazole (DIFLUCAN) 150 MG tablet Take 1 tab PO q72h for yeast infection 12/23/19   Eusebio Friendly B, PA-C  Multiple Vitamins-Minerals (HAIR SKIN & NAILS ADVANCED PO) Take by mouth.     [provider]  drospirenone-ethinyl estradiol (YAZ) 3-0.02 MG tablet Take 1 tablet by mouth daily. 08/14/15 07/09/16  Karie Schwalbe, MD    Family History Family History  Problem Relation Age of Onset  . Hypertension Sister   . Diabetes Maternal Grandmother     Social History Social History   Tobacco Use  . Smoking status: Never Smoker  . Smokeless tobacco: Never Used  Vaping Use  . Vaping Use: Never used  Substance Use Topics  . Alcohol use: Yes    Comment: occassionally  . Drug use: No     Allergies   Patient has no known allergies.   Review of Systems Review of Systems  Constitutional: Negative for fatigue and fever.  Musculoskeletal: Positive for joint swelling. Negative for arthralgias.  Skin: Positive for color change. Negative for rash and wound.  Neurological: Negative for weakness and numbness.     Physical Exam Triage Vital Signs ED Triage Vitals  Enc Vitals Group     BP 12/23/19 0834 114/82     Pulse Rate 12/23/19 0834 87     Resp 12/23/19 0834 14     Temp 12/23/19 0834 98.6 F (37 C)     Temp Source 12/23/19 0834 Oral     SpO2 12/23/19 0834 100 %     Weight 12/23/19 0831 116 lb (52.6 kg)  Height 12/23/19 0831 5\' 5"  (1.651 m)     Head Circumference --      Peak Flow --      Pain Score 12/23/19 0830 1     Pain Loc --      Pain Edu? --      Excl. in GC? --    No data found.  Updated Vital Signs BP 114/82 (BP Location: Right Arm)   Pulse 87   Temp 98.6 F (37 C) (Oral)   Resp 14   Ht 5\' 5"  (1.651 m)   Wt 116 lb (52.6 kg)   LMP 12/22/2019 (Exact Date)   SpO2 100%   BMI 19.30 kg/m       Physical Exam Vitals and nursing note reviewed.  Constitutional:      General: She is not in acute distress.    Appearance: Normal appearance. She is not ill-appearing or toxic-appearing.  HENT:     Head: Normocephalic and atraumatic.  Eyes:     General: No scleral icterus.       Right eye: No discharge.        Left eye: No  discharge.     Conjunctiva/sclera: Conjunctivae normal.  Cardiovascular:     Rate and Rhythm: Normal rate and regular rhythm.     Pulses: Normal pulses.  Pulmonary:     Effort: Pulmonary effort is normal. No respiratory distress.  Musculoskeletal:     Cervical back: Neck supple.  Skin:    General: Skin is dry.     Comments: Of the right third digit, there is erythema and swelling from the DIP distally.  There is paronychia present at the lateral nail fold.  Area is tender.  Full range of motion of finger at all joints.  Skin is warm.  No drainage.  Neurological:     General: No focal deficit present.     Mental Status: She is alert. Mental status is at baseline.     Motor: No weakness.     Gait: Gait normal.  Psychiatric:        Mood and Affect: Mood normal.        Behavior: Behavior normal.        Thought Content: Thought content normal.      UC Treatments / Results  Labs (all labs ordered are listed, but only abnormal results are displayed) Labs Reviewed - No data to display  EKG   Radiology No results found.  Procedures Incision and Drainage  Date/Time: 12/23/2019 9:07 AM Performed by: 12/24/2019, PA-C Authorized by: 12/25/2019, PA-C   Consent:    Consent obtained:  Verbal   Consent given by:  Patient   Risks discussed:  Bleeding, infection, incomplete drainage and pain   Alternatives discussed:  Alternative treatment, delayed treatment and observation Location:    Indications for incision and drainage: paronychia.   Location:  Upper extremity   Upper extremity location:  Finger   Finger location:  R long finger Pre-procedure details:    Skin preparation:  Chloraprep Procedure type:    Complexity:  Simple Procedure details:    Needle aspiration: no     Incision type: 18 G at nail fold.   Drainage:  Purulent   Drainage amount:  Scant   Wound treatment:  Wound left open Post-procedure details:    Patient tolerance of procedure:  Tolerated  well, no immediate complications   (including critical care time)  Medications Ordered in UC Medications - No data to  display  Initial Impression / Assessment and Plan / UC Course  I have reviewed the triage vital signs and the nursing notes.  Pertinent labs & imaging results that were available during my care of the patient were reviewed by me and considered in my medical decision making (see chart for details).   Paronychia successfully drained with 18-gauge needle at nail fold.  Advised warm water soaks and discussed wound care.  Prescribed 5 days of doxycycline to help with remaining infection.  Patient requested Diflucan as she sometimes gets yeast infection if she takes antibiotics.  ED precautions discussed for any worsening symptoms.  Final Clinical Impressions(s) / UC Diagnoses   Final diagnoses:  Paronychia of finger of right hand   Discharge Instructions   None    ED Prescriptions    Medication Sig Dispense Auth. Provider   doxycycline (VIBRAMYCIN) 100 MG capsule Take 1 capsule (100 mg total) by mouth 2 (two) times daily for 5 days. 10 capsule Eusebio Friendly B, PA-C   fluconazole (DIFLUCAN) 150 MG tablet Take 1 tab PO q72h for yeast infection 7 tablet Shirlee Latch, PA-C     PDMP not reviewed this encounter.   Shirlee Latch, PA-C 12/23/19 [phone]

## 2020-05-21 ENCOUNTER — Other Ambulatory Visit: Payer: Self-pay

## 2020-05-21 ENCOUNTER — Ambulatory Visit
Admission: EM | Admit: 2020-05-21 | Discharge: 2020-05-21 | Disposition: A | Discharge status: Home / Self Care | Payer: Commercial Managed Care - PPO | Attending: Physician Assistant | Admitting: Physician Assistant

## 2020-05-21 ENCOUNTER — Encounter: Payer: Self-pay | Admitting: Emergency Medicine

## 2020-05-21 DIAGNOSIS — L0201 Cutaneous abscess of face: Secondary | ICD-10-CM

## 2020-05-21 MED ORDER — FLUCONAZOLE 150 MG PO TABS: ORAL_TABLET | ORAL | 0 refills | Status: DC

## 2020-05-21 MED ORDER — DOXYCYCLINE HYCLATE 100 MG PO CAPS: 100.0000 mg | ORAL_CAPSULE | Freq: Two times a day (BID) | ORAL | 0 refills | Status: AC

## 2020-05-21 NOTE — ED Triage Notes (Signed)
Pt has an abscess on the right side of her chin. Started about 2-3 days ago. Area is swollen and red. Denies fever. She has been able to get some drainage from it.

## 2020-05-21 NOTE — ED Provider Notes (Signed)
[facility]    CSN: [id number] Arrival date & time: 05/21/20  1040      History   Chief Complaint Chief Complaint  Patient presents with  . Abscess    HPI Diane Suarez is a 35 y.o. female presenting for swelling and erythema on the underside of her chin for about 2 to 3 days.  Patient says that she had like a pimple or an abscess and was able to get some pustular material.  She says she is applied warm compresses and that has decreased the swelling little bit but it is still very painful and enlarged.  Denies any associated fever or fatigue.  Has not applied any over-the-counter topical ointments or creams.  Has not taken anything for pain relief.  Denies any history of recurrent skin infections.  Patient seen in October 2021 for simple paronychia of finger which resolved with doxycycline.  She has no significant or contributing past medical problems.  No other concerns.  HPI  History reviewed. No pertinent past medical history.  Patient Active Problem List   Diagnosis Date Noted  . Conjunctivitis 01/14/2018  . Sternal pain 12/19/2014  . Preventative health care 01/25/2014  . Contraception management 07/03/2013  . Allergic rhinitis 08/26/2012  . Fibrocystic breast changes 03/21/2012  . Exposure to sexually transmitted disease (STD) 07/01/2011    Past Surgical History:  Procedure Laterality Date  . DILATION AND CURETTAGE OF UTERUS     for TOP    OB History   No obstetric history on file.      Home Medications    Prior to Admission medications   Medication Sig Start Date End Date Taking? Authorizing Provider  CRANBERRY EXTRACT PO Take by mouth.   Yes [provider]  doxycycline (VIBRAMYCIN) 100 MG capsule Take 1 capsule (100 mg total) by mouth 2 (two) times daily for 7 days. 05/21/20 05/28/20 Yes Shirlee Latch, PA-C  fluconazole (DIFLUCAN) 150 MG tablet Take 1 tablet by mouth every 72 hours as needed for yeast infection 05/21/20  Yes Shirlee Latch, PA-C  Multiple Vitamin (MULTIVITAMIN) capsule Take 1 capsule by mouth daily.   Yes [provider]  Multiple Vitamins-Minerals (HAIR SKIN & NAILS ADVANCED PO) Take by mouth.   Yes [provider]  drospirenone-ethinyl estradiol (YAZ) 3-0.02 MG tablet Take 1 tablet by mouth daily. 08/14/15 07/09/16  Karie Schwalbe, MD    Family History Family History  Problem Relation Age of Onset  . Hypertension Sister   . Diabetes Maternal Grandmother     Social History Social History   Tobacco Use  . Smoking status: Never Smoker  . Smokeless tobacco: Never Used  Vaping Use  . Vaping Use: Never used  Substance Use Topics  . Alcohol use: Yes    Comment: occassionally  . Drug use: No     Allergies   Patient has no known allergies.   Review of Systems Review of Systems  Constitutional: Negative for chills, diaphoresis, fatigue and fever.  HENT: Negative for congestion, rhinorrhea and sore throat.   Respiratory: Negative for cough.   Gastrointestinal: Negative for nausea and vomiting.  Skin: Positive for color change.  Neurological: Negative for weakness and headaches.  Hematological: Negative for adenopathy.     Physical Exam Triage Vital Signs ED Triage Vitals  Enc Vitals Group     BP 05/21/20 1111 118/73     Pulse Rate 05/21/20 1111 85     Resp 05/21/20 1111 18  Temp 05/21/20 1111 98.3 F (36.8 C)     Temp Source 05/21/20 1111 Oral     SpO2 05/21/20 1111 100 %     Weight 05/21/20 1109 115 lb 15.4 oz (52.6 kg)     Height 05/21/20 1109 5\' 5"  (1.651 m)     Head Circumference --      Peak Flow --      Pain Score 05/21/20 1108 0     Pain Loc --      Pain Edu? --      Excl. in GC? --    No data found.  Updated Vital Signs BP 118/73 (BP Location: Left Arm)   Pulse 85   Temp 98.3 F (36.8 C) (Oral)   Resp 18   Ht 5\' 5"  (1.651 m)   Wt 115 lb 15.4 oz (52.6 kg)   LMP 04/23/2020   SpO2 100%   BMI 19.30 kg/m       Physical Exam Vitals  and nursing note reviewed.  Constitutional:      General: She is not in acute distress.    Appearance: Normal appearance. She is not ill-appearing or toxic-appearing.  HENT:     Head: Normocephalic and atraumatic.     Nose: Nose normal.     Mouth/Throat:     Mouth: Mucous membranes are moist.     Pharynx: Oropharynx is clear.  Eyes:     General: No scleral icterus.       Right eye: No discharge.        Left eye: No discharge.     Conjunctiva/sclera: Conjunctivae normal.  Cardiovascular:     Rate and Rhythm: Normal rate and regular rhythm.     Heart sounds: Normal heart sounds.  Pulmonary:     Effort: Pulmonary effort is normal. No respiratory distress.     Breath sounds: Normal breath sounds.  Musculoskeletal:     Cervical back: Neck supple.  Skin:    General: Skin is dry.     Findings: Lesion (2 cm x 2 cm area of erythema/warmth/induration and tenderness of chin. Area is not draining and not fluctuant) present.  Neurological:     General: No focal deficit present.     Mental Status: She is alert. Mental status is at baseline.     Motor: No weakness.     Gait: Gait normal.  Psychiatric:        Mood and Affect: Mood normal.        Behavior: Behavior normal.        Thought Content: Thought content normal.      UC Treatments / Results  Labs (all labs ordered are listed, but only abnormal results are displayed) Labs Reviewed - No data to display  EKG   Radiology No results found.  Procedures Procedures (including critical care time)  Medications Ordered in UC Medications - No data to display  Initial Impression / Assessment and Plan / UC Course  I have reviewed the triage vital signs and the nursing notes.  Pertinent labs & imaging results that were available during my care of the patient were reviewed by me and considered in my medical decision making (see chart for details).    35 year old female presenting for abscess of chin.  Area is debrided and not  fluctuant.  No fever and no red flag signs or symptoms.  No I&D indicated based on exam.  Patient has taken doxycycline in the past and did well.  I prescribed this again  and also sent in Diflucan since she states that she often gets yeast infections when she takes antibiotics.  Advised to continue supportive care with warm compresses and ibuprofen or Tylenol for pain relief.  Follow-up as needed.  Final Clinical Impressions(s) / UC Diagnoses   Final diagnoses:  Abscess of chin     Discharge Instructions     Continue using warm compresses on the area.  Use warm/hot moist rags every couple of days.  Some material may drain that is okay.  She is keep the area clean and dry.  You can apply topical Neosporin if you like.  Start and complete the full course of the doxycycline.  The area should start looking a lot better next couple days.  If it does not or if it looks worse you develop fever you need to be seen again immediately.    ED Prescriptions    Medication Sig Dispense Auth. Provider   doxycycline (VIBRAMYCIN) 100 MG capsule Take 1 capsule (100 mg total) by mouth 2 (two) times daily for 7 days. 14 capsule Eusebio Friendly B, PA-C   fluconazole (DIFLUCAN) 150 MG tablet Take 1 tablet by mouth every 72 hours as needed for yeast infection 2 tablet Shirlee Latch, PA-C     PDMP not reviewed this encounter.   Shirlee Latch, PA-C 05/21/20 1136

## 2020-05-21 NOTE — Discharge Instructions (Signed)
Continue using warm compresses on the area.  Use warm/hot moist rags every couple of days.  Some material may drain that is okay.  She is keep the area clean and dry.  You can apply topical Neosporin if you like.  Start and complete the full course of the doxycycline.  The area should start looking a lot better next couple days.  If it does not or if it looks worse you develop fever you need to be seen again immediately.

## 2021-06-17 ENCOUNTER — Ambulatory Visit: Payer: Commercial Managed Care - PPO | Admitting: Internal Medicine

## 2021-06-17 ENCOUNTER — Encounter: Payer: Self-pay | Admitting: Internal Medicine

## 2021-06-17 VITALS — BP 108/68 | HR 57 | Ht 65.0 in | Wt 120.0 lb

## 2021-06-17 DIAGNOSIS — Z0000 Encounter for general adult medical examination without abnormal findings: Secondary | ICD-10-CM | POA: Diagnosis not present

## 2021-06-17 DIAGNOSIS — Z202 Contact with and (suspected) exposure to infections with a predominantly sexual mode of transmission: Secondary | ICD-10-CM | POA: Diagnosis not present

## 2021-06-17 DIAGNOSIS — Z23 Encounter for immunization: Secondary | ICD-10-CM | POA: Diagnosis not present

## 2021-06-17 LAB — LIPID PANEL
Cholesterol: 174 mg/dL (ref 0–200)
HDL: 86.4 mg/dL (ref >39.00)
LDL Cholesterol: 78 mg/dL (ref 0–99)
NonHDL: 87.96
Total CHOL/HDL Ratio: 2
Triglycerides: 51 mg/dL (ref 0.0–149.0)
VLDL: 10.2 mg/dL (ref 0.0–40.0)

## 2021-06-17 LAB — GLUCOSE, RANDOM: Glucose, Bld: 83 mg/dL (ref 70–99)

## 2021-06-17 NOTE — Progress Notes (Signed)
? ?Subjective:  ? ? Patient ID: Diane Suarez, female    DOB: Jul 19, 1985, 36 y.o.   MRN: [id number] ? ?HPI ?Here for physical ?Wonders about STD testing ?New relationship---intermittent with condoms ? ?Same job ?Part time doing "[employer]"---stays active with that ? ?Current Outpatient Medications on File Prior to Visit  ?Medication Sig Dispense Refill  ? CRANBERRY EXTRACT PO Take by mouth.    ? Multiple Vitamin (MULTIVITAMIN) capsule Take 1 capsule by mouth daily.    ? Multiple Vitamins-Minerals (HAIR SKIN & NAILS ADVANCED PO) Take by mouth.    ? [DISCONTINUED] drospirenone-ethinyl estradiol (YAZ) 3-0.02 MG tablet Take 1 tablet by mouth daily. 1 Package 11  ? ?No current facility-administered medications on file prior to visit.  ? ? ?No Known Allergies ? ?History reviewed. No pertinent past medical history. ? ?Past Surgical History:  ?Procedure Laterality Date  ? DILATION AND CURETTAGE OF UTERUS    ? for TOP  ? ? ?Family History  ?Problem Relation Age of Onset  ? Hypertension Sister   ? Diabetes Maternal Grandmother   ? ? ?Social History  ? ?Socioeconomic History  ? Marital status: Single  ?  Spouse name: Not on file  ? Number of children: 1  ? Years of education: Not on file  ? Highest education level: Not on file  ?Occupational History  ? Occupation: Billing/operations   ?  Employer: [employer]  ?Tobacco Use  ? Smoking status: Never  ? Smokeless tobacco: Never  ?Vaping Use  ? Vaping Use: Never used  ?Substance and Sexual Activity  ? Alcohol use: Yes  ?  Comment: occassionally  ? Drug use: No  ? Sexual activity: Yes  ?  Birth control/protection: I.U.D.  ?Other Topics Concern  ? Not on file  ?Social History Narrative  ? Not on file  ? ?Social Determinants of Health  ? ?Financial Resource Strain: Not on file  ?Food Insecurity: Not on file  ?Transportation Needs: Not on file  ?Physical Activity: Not on file  ?Stress: Not on file  ?Social Connections: Not on file  ?Intimate Partner Violence: Not on file   ? ?Review of Systems  ?Constitutional:  Negative for fatigue and unexpected weight change.  ?     Wears seat belt  ?HENT:  Negative for dental problem, hearing loss and tinnitus.   ?Eyes:  Negative for visual disturbance.  ?     No diplopia or unilateral vision loss  ?Respiratory:  Negative for cough, chest tightness and shortness of breath.   ?Cardiovascular:  Negative for chest pain, palpitations and leg swelling.  ?Gastrointestinal:  Negative for blood in stool.  ?     Some slow bowels---no Rx  ?Endocrine: Negative for polydipsia and polyuria.  ?Genitourinary:  Negative for difficulty urinating, dyspareunia and dysuria.  ?     Periods are regular  ?Musculoskeletal:  Negative for arthralgias, back pain and joint swelling.  ?Skin:  Negative for rash.  ?Allergic/Immunologic: Negative for environmental allergies and immunocompromised state.  ?Neurological:  Negative for dizziness, syncope, light-headedness and headaches.  ?Hematological:  Negative for adenopathy. Does not bruise/bleed easily.  ?Psychiatric/Behavioral:  Negative for dysphoric mood and sleep disturbance. The patient is not nervous/anxious.   ? ?   ?Objective:  ? Physical Exam ?Constitutional:   ?   Appearance: Normal appearance.  ?HENT:  ?   Mouth/Throat:  ?   Pharynx: No oropharyngeal exudate or posterior oropharyngeal erythema.  ?Eyes:  ?   Conjunctiva/sclera: Conjunctivae normal.  ?  Pupils: Pupils are equal, round, and reactive to light.  ?Cardiovascular:  ?   Rate and Rhythm: Normal rate and regular rhythm.  ?   Pulses: Normal pulses.  ?   Heart sounds: No murmur heard. ?  No gallop.  ?Pulmonary:  ?   Effort: Pulmonary effort is normal.  ?   Breath sounds: Normal breath sounds. No wheezing or rales.  ?Abdominal:  ?   Palpations: Abdomen is soft.  ?   Tenderness: There is no abdominal tenderness.  ?Musculoskeletal:  ?   Cervical back: Neck supple.  ?   Right lower leg: No edema.  ?   Left lower leg: No edema.  ?Lymphadenopathy:  ?   Cervical: No  cervical adenopathy.  ?Skin: ?   Findings: No lesion or rash.  ?Neurological:  ?   General: No focal deficit present.  ?   Mental Status: She is alert and oriented to person, place, and time.  ?Psychiatric:     ?   Mood and Affect: Mood normal.     ?   Behavior: Behavior normal.  ?  ? ? ? ? ?   ?Assessment & Plan:  ? ?

## 2021-06-17 NOTE — Addendum Note (Signed)
Addended by: Winn Jock on: 06/17/2021 09:13 AM ? ? Modules accepted: Orders ? ?

## 2021-06-17 NOTE — Assessment & Plan Note (Signed)
Healthy ?Discussed fitness ?Will get flu vaccine in the fall---recommended bivalent COVID also ?Will do STD testing---counseled ?Pap due 2025 ?

## 2021-06-18 LAB — C. TRACHOMATIS/N. GONORRHOEAE RNA
C. trachomatis RNA, TMA: NOT DETECTED
N. gonorrhoeae RNA, TMA: NOT DETECTED

## 2021-06-18 LAB — RPR: RPR Ser Ql: NONREACTIVE

## 2021-06-18 LAB — HIV ANTIBODY (ROUTINE TESTING W REFLEX): HIV 1&2 Ab, 4th Generation: NONREACTIVE

## 2022-03-04 ENCOUNTER — Encounter: Payer: Self-pay | Admitting: Internal Medicine

## 2022-03-04 ENCOUNTER — Ambulatory Visit (INDEPENDENT_AMBULATORY_CARE_PROVIDER_SITE_OTHER): Payer: Commercial Managed Care - PPO | Admitting: Internal Medicine

## 2022-03-04 VITALS — BP 118/72 | HR 62 | Temp 97.4°F | Ht 65.0 in | Wt 119.0 lb

## 2022-03-04 DIAGNOSIS — Z3201 Encounter for pregnancy test, result positive: Secondary | ICD-10-CM | POA: Diagnosis not present

## 2022-03-04 DIAGNOSIS — Z3490 Encounter for supervision of normal pregnancy, unspecified, unspecified trimester: Secondary | ICD-10-CM | POA: Diagnosis not present

## 2022-03-04 LAB — POCT URINE PREGNANCY: Preg Test, Ur: POSITIVE — AB

## 2022-03-04 NOTE — Progress Notes (Signed)
   Subjective:    Patient ID: Diane Suarez, female    DOB: 06-Dec-1985, 36 y.o.   MRN: [id number]  HPI Here with significant other to discuss apparent pregnancy  LMP was 11/20 Generally regular Stopped OCP Not specifically trying but inconsistent with condoms  Is excited but a little surprised  Current Outpatient Medications on File Prior to Visit  Medication Sig Dispense Refill   Multiple Vitamin (MULTIVITAMIN) capsule Take 1 capsule by mouth daily.     Multiple Vitamins-Minerals (HAIR SKIN & NAILS ADVANCED PO) Take by mouth.     [DISCONTINUED] drospirenone-ethinyl estradiol (YAZ) 3-0.02 MG tablet Take 1 tablet by mouth daily. 1 Package 11   No current facility-administered medications on file prior to visit.    No Known Allergies  History reviewed. No pertinent past medical history.  Past Surgical History:  Procedure Laterality Date   DILATION AND CURETTAGE OF UTERUS     for TOP    Family History  Problem Relation Age of Onset   Hypertension Sister    Diabetes Maternal Grandmother     Social History   Socioeconomic History   Marital status: Single    Spouse name: Not on file   Number of children: 1   Years of education: Not on file   Highest education level: Not on file  Occupational History   Occupation: Billing/operations     Employer: [employer]  Tobacco Use   Smoking status: Never    Passive exposure: Never   Smokeless tobacco: Never  Vaping Use   Vaping Use: Never used  Substance and Sexual Activity   Alcohol use: Yes    Comment: occassionally   Drug use: No   Sexual activity: Yes    Birth control/protection: I.U.D.  Other Topics Concern   Not on file  Social History Narrative   Not on file   Social Determinants of Health   Financial Resource Strain: Not on file  Food Insecurity: Not on file  Transportation Needs: Not on file  Physical Activity: Not on file  Stress: Not on file  Social Connections: Not on file  Intimate Partner  Violence: Not on file   Review of Systems     Objective:   Physical Exam         Assessment & Plan:

## 2022-03-04 NOTE — Assessment & Plan Note (Signed)
Urine test today confirms pregnancy She will seek out obstetrician---will do some research Requests that I see the baby---okay, for her

## 2022-03-10 ENCOUNTER — Encounter: Payer: Self-pay | Admitting: Internal Medicine

## 2022-05-26 ENCOUNTER — Encounter: Payer: Self-pay | Admitting: Internal Medicine

## 2022-07-01 DIAGNOSIS — H5213 Myopia, bilateral: Secondary | ICD-10-CM | POA: Diagnosis not present

## 2022-08-07 ENCOUNTER — Encounter: Payer: Self-pay | Admitting: Internal Medicine

## 2022-09-06 ENCOUNTER — Observation Stay
Admission: EM | Admit: 2022-09-06 | Discharge: 2022-09-06 | Disposition: A | Discharge status: Home / Self Care | Payer: BC Managed Care – PPO | Attending: Obstetrics and Gynecology | Admitting: Obstetrics and Gynecology

## 2022-09-06 ENCOUNTER — Other Ambulatory Visit: Payer: Self-pay

## 2022-09-06 ENCOUNTER — Encounter: Payer: Self-pay | Admitting: Obstetrics and Gynecology

## 2022-09-06 DIAGNOSIS — Z87891 Personal history of nicotine dependence: Secondary | ICD-10-CM | POA: Diagnosis not present

## 2022-09-06 DIAGNOSIS — O368190 Decreased fetal movements, unspecified trimester, not applicable or unspecified: Secondary | ICD-10-CM | POA: Diagnosis present

## 2022-09-06 DIAGNOSIS — O368130 Decreased fetal movements, third trimester, not applicable or unspecified: Secondary | ICD-10-CM | POA: Diagnosis not present

## 2022-09-06 DIAGNOSIS — Z3A32 32 weeks gestation of pregnancy: Secondary | ICD-10-CM | POA: Diagnosis not present

## 2022-09-06 MED ORDER — ACETAMINOPHEN 325 MG PO TABS: 650.0000 mg | ORAL_TABLET | ORAL | Status: DC | PRN

## 2022-09-06 NOTE — OB Triage Note (Signed)
Pt showed reactive NST and has felt fetal movement while here. Tylenol offered for headache but pt declined. Pt discharged by Beckey Downing, CNM, alert and ambulatory.

## 2022-09-06 NOTE — OB Triage Note (Signed)
37 y.o G2P1 presents to L&D triage at [redacted]w[redacted]d with reports of reduced fetal movement and headache. She reports feeling her baby this morning but mentions he's been less active overall today; pt has felt baby move since being here and fetal movement visualized by me. Pt has h/o preeclampsia w first child but not with this pregnancy. She plans on delivering with [facility] and has an appt with them on Friday. Pt denies vaginal bleeding, ctx's, abnormal discharge, and lof. [place] CNM notified, pt shows reactive NST.

## 2022-09-06 NOTE — Discharge Summary (Signed)
Patient ID: Diane Suarez MRN: [id number] DOB/AGE: 12-Nov-1985 37 y.o.  Admit date: 09/06/2022 Discharge date: 09/06/2022  Admission Diagnoses: 37yo G1P0 at [redacted]w[redacted]d presents with decreased fetal movement since this am.  She also reports a HA.  Denies VB, LOF, or UCs  Discharge Diagnoses: RNST  Factors complicating pregnancy: (per pt) H/o Pre-E  Prenatal Procedures: NST  Consults: None  Significant Diagnostic Studies:  No results found for this or any previous visit (from the past 168 hour(s)).  Treatments: analgesia: acetaminophen  Hospital Course:  This is a37yo G1P0 at [redacted]w[redacted]d presents with decreased fetal movement since this am and a HA.  No leaking of fluid and no bleeding.  NST was reactive and she was given Tylenol which was able to resolve her HA.  She was observed, fetal heart rate monitoring remained reassuring, and she had no signs/symptoms of labor or other maternal-fetal concerns.  She was deemed stable for discharge to home with outpatient follow up.  Discharge Physical Exam:  LMP 01/25/2022   General: NAD CV: RRR Pulm: CTABL, nl effort ABD: s/nd/nt, gravid DVT Evaluation: LE non-ttp, no evidence of DVT on exam.  NST: FHR baseline: 130 bpm Variability: moderate Accelerations: yes Decelerations: none Category/reactivity: reactive   TOCO: quiet SVE: deferred      Discharge Condition: Stable  Disposition: Discharge disposition: 01-Home or Self Care        Allergies as of 09/06/2022   No Known Allergies      Medication List     TAKE these medications    HAIR SKIN & NAILS ADVANCED PO Take by mouth.   multivitamin capsule Take 1 capsule by mouth daily.         SignedHaroldine Laws, CNM 09/06/2022 5:17 PM

## 2022-10-08 DIAGNOSIS — Z364 Encounter for antenatal screening for fetal growth retardation: Secondary | ICD-10-CM | POA: Diagnosis not present

## 2022-10-08 DIAGNOSIS — O133 Gestational [pregnancy-induced] hypertension without significant proteinuria, third trimester: Secondary | ICD-10-CM | POA: Diagnosis not present

## 2022-10-08 DIAGNOSIS — O09523 Supervision of elderly multigravida, third trimester: Secondary | ICD-10-CM | POA: Diagnosis not present

## 2022-10-08 DIAGNOSIS — Z3A36 36 weeks gestation of pregnancy: Secondary | ICD-10-CM | POA: Diagnosis not present

## 2022-10-08 DIAGNOSIS — O365930 Maternal care for other known or suspected poor fetal growth, third trimester, not applicable or unspecified: Secondary | ICD-10-CM | POA: Diagnosis not present

## 2022-10-13 DIAGNOSIS — O133 Gestational [pregnancy-induced] hypertension without significant proteinuria, third trimester: Secondary | ICD-10-CM | POA: Diagnosis not present

## 2022-10-13 DIAGNOSIS — O43193 Other malformation of placenta, third trimester: Secondary | ICD-10-CM | POA: Diagnosis not present

## 2022-10-13 DIAGNOSIS — O09523 Supervision of elderly multigravida, third trimester: Secondary | ICD-10-CM | POA: Diagnosis not present

## 2022-10-13 DIAGNOSIS — O365930 Maternal care for other known or suspected poor fetal growth, third trimester, not applicable or unspecified: Secondary | ICD-10-CM | POA: Diagnosis not present

## 2022-10-13 DIAGNOSIS — O99824 Streptococcus B carrier state complicating childbirth: Secondary | ICD-10-CM | POA: Diagnosis not present

## 2022-10-13 DIAGNOSIS — Z3A37 37 weeks gestation of pregnancy: Secondary | ICD-10-CM | POA: Diagnosis not present

## 2022-10-13 DIAGNOSIS — O1414 Severe pre-eclampsia complicating childbirth: Secondary | ICD-10-CM | POA: Diagnosis not present

## 2022-10-13 DIAGNOSIS — Z302 Encounter for sterilization: Secondary | ICD-10-CM | POA: Diagnosis not present

## 2022-11-01 ENCOUNTER — Inpatient Hospital Stay (HOSPITAL_COMMUNITY): Admission: AD | Admit: 2022-11-01 | Payer: BC Managed Care – PPO | Source: Home / Self Care

## 2022-12-08 DIAGNOSIS — Z419 Encounter for procedure for purposes other than remedying health state, unspecified: Secondary | ICD-10-CM | POA: Diagnosis not present

## 2023-01-08 DIAGNOSIS — Z419 Encounter for procedure for purposes other than remedying health state, unspecified: Secondary | ICD-10-CM | POA: Diagnosis not present

## 2023-01-14 ENCOUNTER — Encounter: Payer: Self-pay | Admitting: Internal Medicine

## 2023-01-15 ENCOUNTER — Other Ambulatory Visit: Payer: Medicaid Other

## 2023-01-15 DIAGNOSIS — Z130 Encounter for screening for diseases of the blood and blood-forming organs and certain disorders involving the immune mechanism: Secondary | ICD-10-CM | POA: Diagnosis not present

## 2023-01-15 NOTE — Progress Notes (Signed)
In nurse clinic for sickle cell testing, as patient recently found out that her son has sickle cell trait. ROI signed. Sickle cell consent form signed. Best contact number in chart. Advised patient results may take up to 2 weeks, but [facility] will call once results are available. Patient understands and walked to lab for venipuncture.   Abagail Kitchens, RN

## 2023-01-25 ENCOUNTER — Telehealth: Payer: Self-pay

## 2023-01-25 NOTE — Telephone Encounter (Signed)
Client reached by phone and notified that results ready.  Client with ROI on file, and will pick up results from information booth.   Parth Mccormac Sherrilyn Rist, RN

## 2023-02-07 DIAGNOSIS — Z419 Encounter for procedure for purposes other than remedying health state, unspecified: Secondary | ICD-10-CM | POA: Diagnosis not present

## 2023-02-12 ENCOUNTER — Encounter: Payer: Self-pay | Admitting: Internal Medicine

## 2023-02-22 ENCOUNTER — Encounter: Payer: Self-pay | Admitting: Internal Medicine

## 2023-03-10 DIAGNOSIS — Z419 Encounter for procedure for purposes other than remedying health state, unspecified: Secondary | ICD-10-CM | POA: Diagnosis not present

## 2023-04-10 DIAGNOSIS — Z419 Encounter for procedure for purposes other than remedying health state, unspecified: Secondary | ICD-10-CM | POA: Diagnosis not present

## 2023-04-12 ENCOUNTER — Encounter: Payer: Self-pay | Admitting: Internal Medicine

## 2023-05-08 DIAGNOSIS — Z419 Encounter for procedure for purposes other than remedying health state, unspecified: Secondary | ICD-10-CM | POA: Diagnosis not present

## 2023-06-12 ENCOUNTER — Encounter: Payer: Self-pay | Admitting: Internal Medicine

## 2023-06-16 DIAGNOSIS — H5213 Myopia, bilateral: Secondary | ICD-10-CM | POA: Diagnosis not present

## 2023-06-19 DIAGNOSIS — Z419 Encounter for procedure for purposes other than remedying health state, unspecified: Secondary | ICD-10-CM | POA: Diagnosis not present

## 2023-07-01 DIAGNOSIS — H5213 Myopia, bilateral: Secondary | ICD-10-CM | POA: Diagnosis not present

## 2023-07-19 DIAGNOSIS — Z419 Encounter for procedure for purposes other than remedying health state, unspecified: Secondary | ICD-10-CM | POA: Diagnosis not present

## 2023-08-19 DIAGNOSIS — Z419 Encounter for procedure for purposes other than remedying health state, unspecified: Secondary | ICD-10-CM | POA: Diagnosis not present

## 2023-09-08 ENCOUNTER — Encounter: Payer: Self-pay | Admitting: Internal Medicine

## 2023-09-08 ENCOUNTER — Ambulatory Visit (INDEPENDENT_AMBULATORY_CARE_PROVIDER_SITE_OTHER): Admitting: Internal Medicine

## 2023-09-08 VITALS — BP 100/60 | HR 74 | Temp 98.5°F | Ht 65.0 in | Wt 118.0 lb

## 2023-09-08 DIAGNOSIS — Z0000 Encounter for general adult medical examination without abnormal findings: Secondary | ICD-10-CM | POA: Diagnosis not present

## 2023-09-08 NOTE — Progress Notes (Signed)
 Subjective:    Patient ID: Diane Suarez, female    DOB: 1985-09-04, 38 y.o.   MRN: [id number]  HPI Here for physical  Has 79 month old son now Doing well Back to work Does some exercise Had tubal  Current Outpatient Medications on File Prior to Visit  Medication Sig Dispense Refill   Multiple Vitamin (MULTIVITAMIN) capsule Take 1 capsule by mouth daily.     Multiple Vitamins-Minerals (HAIR SKIN & NAILS ADVANCED PO) Take by mouth.     [DISCONTINUED] drospirenone -ethinyl estradiol  (YAZ) 3-0.02 MG tablet Take 1 tablet by mouth daily. 1 Package 11   No current facility-administered medications on file prior to visit.    No Known Allergies  History reviewed. No pertinent past medical history.  Past Surgical History:  Procedure Laterality Date   DILATION AND CURETTAGE OF UTERUS     for TOP    Family History  Problem Relation Age of Onset   Hypertension Sister    Diabetes Maternal Grandmother    Sickle cell trait Son     Social History   Socioeconomic History   Marital status: Single    Spouse name: Not on file   Number of children: 2   Years of education: Not on file   Highest education level: Not on file  Occupational History   Occupation: Billing/operations     Employer: [employer]  Tobacco Use   Smoking status: Never    Passive exposure: Never   Smokeless tobacco: Never  Vaping Use   Vaping status: Never Used  Substance and Sexual Activity   Alcohol use: Not Currently    Comment: occassionally   Drug use: No   Sexual activity: Yes    Birth control/protection: None  Other Topics Concern   Not on file  Social History Narrative   Not on file   Social Drivers of Health   Financial Resource Strain: Low Risk  (10/13/2022)   Received from [facility]   Overall Financial Resource Strain (CARDIA)    Difficulty of Paying Living Expenses: Not hard at all  Food Insecurity: No Food Insecurity (10/13/2022)   Received from [facility]   Hunger Vital Sign    Within the past 12 months, you worried that your food would run out before you got the money to buy more.: Never true    Within the past 12 months, the food you bought just didn't last and you didn't have money to get more.: Never true  Transportation Needs: Unknown (10/13/2022)   Received [facility] Transportation    In the past 12 months, has lack of transportation kept you from medical appointments or from getting medications?: No    Lack of Transportation (Non-Medical): Not on file  Physical Activity: Not on file  Stress: Not on file  Social Connections: Not on file  Intimate Partner Violence: Not on file   Review of Systems  Constitutional:  Negative for fatigue and unexpected weight change.       Wears seat belt  HENT:  Negative for dental problem, hearing loss and tinnitus.        Keeps up with dentist  Eyes:  Negative for visual disturbance.       No diplopia or unilateral vision loss  Respiratory:  Negative for cough, chest tightness and shortness of breath.   Cardiovascular:  Negative for chest pain, palpitations and leg swelling.  Gastrointestinal:  Negative for blood  in stool and constipation.       No heartburn  Endocrine: Negative for polydipsia and polyuria.  Genitourinary:  Negative for dyspareunia, dysuria and hematuria.  Musculoskeletal:  Negative for arthralgias, back pain and joint swelling.       Some sciatic nerve symptoms since delivering--now just right. Not disabling---ibuprofen helps  Skin:  Negative for rash.  Allergic/Immunologic: Negative for environmental allergies and immunocompromised state.  Neurological:  Negative for dizziness, syncope, light-headedness and headaches.  Hematological:  Negative for adenopathy. Does not bruise/bleed easily.  Psychiatric/Behavioral:  Negative for dysphoric mood and sleep disturbance. The patient is not nervous/anxious.         Objective:   Physical Exam Constitutional:      Appearance: Normal appearance.  HENT:     Mouth/Throat:     Pharynx: No oropharyngeal exudate or posterior oropharyngeal erythema.  Eyes:     Conjunctiva/sclera: Conjunctivae normal.     Pupils: Pupils are equal, round, and reactive to light.  Cardiovascular:     Rate and Rhythm: Normal rate and regular rhythm.     Pulses: Normal pulses.     Heart sounds: No murmur heard.    No gallop.  Pulmonary:     Effort: Pulmonary effort is normal.     Breath sounds: Normal breath sounds. No wheezing or rales.  Abdominal:     Palpations: Abdomen is soft.     Tenderness: There is no abdominal tenderness.  Musculoskeletal:     Cervical back: Neck supple.     Right lower leg: No edema.     Left lower leg: No edema.  Lymphadenopathy:     Cervical: No cervical adenopathy.  Skin:    Findings: No rash.  Neurological:     General: No focal deficit present.     Mental Status: She is alert and oriented to person, place, and time.  Psychiatric:        Mood and Affect: Mood normal.        Behavior: Behavior normal.            Assessment & Plan:

## 2023-09-08 NOTE — Assessment & Plan Note (Signed)
 Healthy Counseling done Flu vaccine in the fall Discussed labs--will hold off Mammograms starting next year

## 2023-09-13 ENCOUNTER — Encounter: Payer: Self-pay | Admitting: Internal Medicine

## 2023-09-18 DIAGNOSIS — Z419 Encounter for procedure for purposes other than remedying health state, unspecified: Secondary | ICD-10-CM | POA: Diagnosis not present

## 2023-10-19 DIAGNOSIS — Z419 Encounter for procedure for purposes other than remedying health state, unspecified: Secondary | ICD-10-CM | POA: Diagnosis not present

## 2023-10-28 ENCOUNTER — Encounter: Payer: Self-pay | Admitting: Internal Medicine

## 2023-11-19 DIAGNOSIS — Z419 Encounter for procedure for purposes other than remedying health state, unspecified: Secondary | ICD-10-CM | POA: Diagnosis not present

## 2024-02-17 ENCOUNTER — Other Ambulatory Visit: Payer: Self-pay

## 2024-02-17 ENCOUNTER — Emergency Department

## 2024-02-17 ENCOUNTER — Ambulatory Visit: Payer: Self-pay

## 2024-02-17 ENCOUNTER — Emergency Department
Admission: EM | Admit: 2024-02-17 | Discharge: 2024-02-17 | Disposition: A | Discharge status: Home / Self Care | Attending: Emergency Medicine | Admitting: Emergency Medicine

## 2024-02-17 DIAGNOSIS — R519 Headache, unspecified: Secondary | ICD-10-CM | POA: Diagnosis present

## 2024-02-17 DIAGNOSIS — R079 Chest pain, unspecified: Secondary | ICD-10-CM | POA: Diagnosis not present

## 2024-02-17 DIAGNOSIS — R531 Weakness: Secondary | ICD-10-CM | POA: Diagnosis not present

## 2024-02-17 DIAGNOSIS — R110 Nausea: Secondary | ICD-10-CM | POA: Diagnosis not present

## 2024-02-17 DIAGNOSIS — R29898 Other symptoms and signs involving the musculoskeletal system: Secondary | ICD-10-CM

## 2024-02-17 DIAGNOSIS — G43809 Other migraine, not intractable, without status migrainosus: Secondary | ICD-10-CM | POA: Insufficient documentation

## 2024-02-17 DIAGNOSIS — R202 Paresthesia of skin: Secondary | ICD-10-CM | POA: Diagnosis not present

## 2024-02-17 DIAGNOSIS — R200 Anesthesia of skin: Secondary | ICD-10-CM

## 2024-02-17 LAB — DIFFERENTIAL
Abs Immature Granulocytes: 0.02 K/uL (ref 0.00–0.07)
Basophils Absolute: 0 K/uL (ref 0.0–0.1)
Basophils Relative: 0 %
Eosinophils Absolute: 0 K/uL (ref 0.0–0.5)
Eosinophils Relative: 0 %
Immature Granulocytes: 0 %
Lymphocytes Relative: 11 %
Lymphs Abs: 1.1 K/uL (ref 0.7–4.0)
Monocytes Absolute: 0.5 K/uL (ref 0.1–1.0)
Monocytes Relative: 5 %
Neutro Abs: 8 K/uL — ABNORMAL HIGH (ref 1.7–7.7)
Neutrophils Relative %: 84 %

## 2024-02-17 LAB — CBC
HCT: 38.9 % (ref 36.0–46.0)
Hemoglobin: 12.6 g/dL (ref 12.0–15.0)
MCH: 28.6 pg (ref 26.0–34.0)
MCHC: 32.4 g/dL (ref 30.0–36.0)
MCV: 88.4 fL (ref 80.0–100.0)
Platelets: 251 K/uL (ref 150–400)
RBC: 4.4 MIL/uL (ref 3.87–5.11)
RDW: 12.8 % (ref 11.5–15.5)
WBC: 9.6 K/uL (ref 4.0–10.5)
nRBC: 0 % (ref 0.0–0.2)

## 2024-02-17 LAB — PROTIME-INR
INR: 1 (ref 0.8–1.2)
Prothrombin Time: 13.9 s (ref 11.4–15.2)

## 2024-02-17 LAB — APTT: aPTT: 28 s (ref 24–36)

## 2024-02-17 LAB — COMPREHENSIVE METABOLIC PANEL WITH GFR
ALT: 11 U/L (ref 0–44)
AST: 17 U/L (ref 15–41)
Albumin: 4.5 g/dL (ref 3.5–5.0)
Alkaline Phosphatase: 32 U/L — ABNORMAL LOW (ref 38–126)
Anion gap: 13 (ref 5–15)
BUN: 11 mg/dL (ref 6–20)
CO2: 25 mmol/L (ref 22–32)
Calcium: 8.9 mg/dL (ref 8.9–10.3)
Chloride: 101 mmol/L (ref 98–111)
Creatinine, Ser: 0.65 mg/dL (ref 0.44–1.00)
GFR, Estimated: >60 mL/min (ref >60)
Glucose, Bld: 107 mg/dL — ABNORMAL HIGH (ref 70–99)
Potassium: 3.4 mmol/L — ABNORMAL LOW (ref 3.5–5.1)
Sodium: 139 mmol/L (ref 135–145)
Total Bilirubin: 0.6 mg/dL (ref 0.0–1.2)
Total Protein: 7.4 g/dL (ref 6.5–8.1)

## 2024-02-17 LAB — ETHANOL: Alcohol, Ethyl (B): <15 mg/dL (ref <15)

## 2024-02-17 LAB — TROPONIN T, HIGH SENSITIVITY
Troponin T High Sensitivity: <15 ng/L (ref 0–19)
Troponin T High Sensitivity: <15 ng/L (ref 0–19)

## 2024-02-17 LAB — CBG MONITORING, ED: Glucose-Capillary: 96 mg/dL (ref 70–99)

## 2024-02-17 MED ORDER — ONDANSETRON HCL 4 MG/2ML IJ SOLN
4.0000 mg | Freq: Once | INTRAMUSCULAR | Status: AC
  Administered 2024-02-17: 4 mg via INTRAVENOUS
  Filled 2024-02-17: qty 2

## 2024-02-17 MED ORDER — SODIUM CHLORIDE 0.9 % IV BOLUS
1000.0000 mL | Freq: Once | INTRAVENOUS | Status: AC
  Administered 2024-02-17: 1000 mL via INTRAVENOUS

## 2024-02-17 MED ORDER — KETOROLAC TROMETHAMINE 30 MG/ML IJ SOLN
30.0000 mg | Freq: Once | INTRAMUSCULAR | Status: AC
  Administered 2024-02-17: 30 mg via INTRAVENOUS
  Filled 2024-02-17: qty 1

## 2024-02-17 MED ORDER — METOCLOPRAMIDE HCL 5 MG/ML IJ SOLN
10.0000 mg | Freq: Once | INTRAMUSCULAR | Status: AC
  Administered 2024-02-17: 10 mg via INTRAVENOUS
  Filled 2024-02-17: qty 2

## 2024-02-17 MED ORDER — BUTALBITAL-APAP-CAFFEINE 50-325-40 MG PO TABS: 1.0000 | ORAL_TABLET | Freq: Four times a day (QID) | ORAL | 0 refills | Status: AC | PRN

## 2024-02-17 MED ORDER — IOHEXOL 350 MG/ML SOLN
75.0000 mL | Freq: Once | INTRAVENOUS | Status: AC | PRN
  Administered 2024-02-17: 75 mL via INTRAVENOUS

## 2024-02-17 MED ORDER — SODIUM CHLORIDE 0.9% FLUSH
3.0000 mL | Freq: Once | INTRAVENOUS | Status: AC
  Administered 2024-02-17: 3 mL via INTRAVENOUS

## 2024-02-17 MED ORDER — MAGNESIUM SULFATE 2 GM/50ML IV SOLN
2.0000 g | Freq: Once | INTRAVENOUS | Status: AC
  Administered 2024-02-17: 2 g via INTRAVENOUS
  Filled 2024-02-17: qty 50

## 2024-02-17 MED ORDER — ONDANSETRON 8 MG PO TBDP: 8.0000 mg | ORAL_TABLET | Freq: Three times a day (TID) | ORAL | 0 refills | Status: AC | PRN

## 2024-02-17 MED ORDER — DIPHENHYDRAMINE HCL 50 MG/ML IJ SOLN
25.0000 mg | Freq: Once | INTRAMUSCULAR | Status: AC
  Administered 2024-02-17: 25 mg via INTRAVENOUS
  Filled 2024-02-17: qty 1

## 2024-02-17 MED ORDER — CLOPIDOGREL BISULFATE 75 MG PO TABS
300.0000 mg | ORAL_TABLET | Freq: Once | ORAL | Status: AC
  Administered 2024-02-17: 300 mg via ORAL
  Filled 2024-02-17: qty 4

## 2024-02-17 NOTE — ED Notes (Signed)
 Called and activated code stroke spoke to [employer]

## 2024-02-17 NOTE — Consult Note (Signed)
 [facility] Telemedicine Consult   Requesting Provider: Jossie Consult Participants: Nurse Location of the provider: [facility] [place] Location of the patient: [facility] ED  This consult was provided via telemedicine with 2-way video and audio communication. The patient/family was informed that care would be provided in this way and agreed to receive care in this manner.    Chief Complaint: Left sided numbness, weakness, chest pain  HPI: 38 year old female with no significant PMH who reports that she laid down for a nap around 2p and was at baseline.  When she awakened she was nauseous and had headache.  Had chest pain as well.  By the time EMS arrived felt tremulous on the left and noted left arm numbness and weakness.  Patient was brought in as a code stroke. Headache has improved significantly (seemed to be helped by Zofran).  LKW: 02/17/2024 @ 1400 tnk given?: No, Nondisabling symptoms, low likelihood of stroke IR Thrombectomy? No, No target lesion identified Modified Rankin Scale: 0-Completely asymptomatic and back to baseline post- stroke Time of Page: 1735 Time of teleneurologist evaluation: 1735  Exam: Vitals:   02/17/24 1726 02/17/24 1727  BP:  123/85  Pulse:  84  Resp:  17  Temp:  98.6 F (37 C)  SpO2: 100% 95%    General: NAD, tearful  1A: Level of Consciousness - 0 1B: Ask Month and Age - 0 1C: 'Blink Eyes' & 'Squeeze Hands' - 0 2: Test Horizontal Extraocular Movements - 0 3: Test Visual Fields - 0 4: Test Facial Palsy - 0 5A: Test Left Arm Motor Drift - 1 5B: Test Right Arm Motor Drift - 0 6A: Test Left Leg Motor Drift - 1 6B: Test Right Leg Motor Drift - 0 7: Test Limb Ataxia - 0 8: Test Sensation - 1 9: Test Language/Aphasia- 0 10: Test Dysarthria - 0 11: Test Extinction/Inattention - 0 NIHSS score: 3  Additional examination findings-Patient tremulous in LUE and LLE even when not trying to lift extremities.  Initially patient does not lift extremity  but with further prompting is able to get LUE to nose.  Able to maintain against gravity as well.  Is able to do the same with the LLE.    Imaging Reviewed:  CT HEAD WITHOUT CONTRAST 02/17/2024 05:37:32 PM   TECHNIQUE: CT of the head was performed without the administration of intravenous contrast. Automated exposure control, iterative reconstruction, and/or weight based adjustment of the mA/kV was utilized to reduce the radiation dose to as low as reasonably achievable.   COMPARISON: None available.   CLINICAL HISTORY: Neuro deficit, acute, stroke suspected.   FINDINGS:   BRAIN AND VENTRICLES: No acute intracranial hemorrhage. There is subtle asymmetric hypoattenuation in the inferior aspect at the left temporal lobe which is likely secondary to streak artifact. Gray white matter differentiation is otherwise maintained. No evidence of acute infarct. No hydrocephalus. No extra-axial collection. No mass effect or midline shift.   [place] Stroke Program Early CT (ASPECT) Score: Ganglionic (caudate, IC, lentiform nucleus, insula, M1-M3): 7 Supraganglionic (M4-M6): 3 Total: 10   ORBITS: No acute abnormality.   SINUSES: No acute abnormality.   SOFT TISSUES AND SKULL: No acute soft tissue abnormality. No skull fracture.     IMPRESSION: 1. No acute intracranial hemorrhage. 2. Subtle asymmetric hypoattenuation in the inferior left temporal lobe favored secondary to streak artifact. 3. ASPECT score is 10. 4. Findings discussed with Dr. Jossie at 5:53 PM on 02/17/24.  CTA HEAD AND NECK WITHOUT AND WITH 02/17/2024  05:54:15 PM   TECHNIQUE: CTA of the head and neck was performed without and with the administration of 75 mL of iohexol (OMNIPAQUE) 350 MG/ML injection. Multiplanar 2D and/or 3D reformatted images are provided for review. Automated exposure control, iterative reconstruction, and/or weight based adjustment of the mA/kV was utilized to reduce the radiation  dose to as low as reasonably achievable. Stenosis of the internal carotid arteries measured using NASCET criteria.   COMPARISON: None available   CLINICAL HISTORY: Neuro deficit, acute, stroke suspected.   FINDINGS:   CTA NECK: AORTIC ARCH AND ARCH VESSELS: No dissection or arterial injury. No significant stenosis of the brachiocephalic or subclavian arteries.   CERVICAL CAROTID ARTERIES: No dissection, arterial injury, or hemodynamically significant stenosis by NASCET criteria.   CERVICAL VERTEBRAL ARTERIES: No dissection, arterial injury, or significant stenosis.   LUNGS AND MEDIASTINUM: Unremarkable.   SOFT TISSUES: No acute abnormality.   BONES: Indeterminate sclerotic focus in the thoracic spine within the left inferior aspect of the T4 vertebral body. Consider nonemergent MRI of the thoracic spine with and without contrast for further evaluation.   CTA HEAD: ANTERIOR CIRCULATION: There is a 1.6 mm inferiorly directed outpouching along the right supraclinoid ICA favored to reflect an infundibulum at the origin of the right posterior communicating artery less likely a small aneurysm. No significant stenosis of the internal carotid arteries. No significant stenosis of the anterior cerebral arteries. No significant stenosis of the middle cerebral arteries.   POSTERIOR CIRCULATION: No significant stenosis of the posterior cerebral arteries. No significant stenosis of the basilar artery. No significant stenosis of the vertebral arteries. No aneurysm.   OTHER: No dural venous sinus thrombosis on this non-dedicated study.   IMPRESSION: 1. No large vessel occlusion or hemodynamically significant stenosis in the head or neck. 2. 1.6 mm inferiorly directed outpouching along the right supraclinoid ICA is most consistent with an infundibulum at the right posterior communicating artery origin, a small aneurysm is less likely. 3. Indeterminate sclerotic focus at T4.  Consider nonemergent MRI of the thoracic spine with and without contrast for further evaluation.      Labs reviewed in epic and pertinent values follow: CBG 96   Assessment: 38 year old female with no significant PMH who reports that she laid down for a nap around 2p and was at baseline.  When she awakened she was nauseous and had headache.  Had chest pain as well.  By the time EMS arrived felt tremulous on the left and noted left arm numbness and weakness.  Patient was brought in as a code stroke. NIHSS of 3 with only some mild weakness on the left that was attributable to her tremulousness.  Symptoms more consistent with a complicated migraine.  With chest pain also can not rule out dissection.  With nondisabling symptoms and low likelihood of acute infarct, thrombolytics were not administered.  Head CT reviewed and shows no acute changes.  CTA of the head and neck show no evidence of LVO.    Recommendations:  Patient given 325mg  ASA in transport.  Would give 300mg  Plavix now MRI of the brain without contrast.  If no evidence of acute infarct would not pursue stroke w/u and not discharge on antiplatelet therapy.  Otherwise would start dual antiplatelet therapy with ASA 81mg  and Plavix 75 mg daily and initiate stroke work up to include telemetry monitoring, echocardiogram with bubble study, A1c and fasting lipid panel. 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  This patient is receiving care for possible acute neurological changes. Care by this provider at the time of service included time for direct evaluation via telemedicine, review of medical records, imaging studies and discussion of findings with providers, the patient and/or family.  Case discussed with Dr. Jossie Sonny Hock, MD Neurology   If 8pm- 8am, please page neurology on call as listed in AMION.

## 2024-02-17 NOTE — ED Triage Notes (Addendum)
 Pt arrives via [facility] from home with c/o CP and left arm numbness that started around 1530 today. Pt states that they aren't able to lift the left arm without it shaking and pt isn't able to get it much off the arm of the wheelchair. Pt states that the CP is at the base of their sternum. Pt denies any vision changes, any slurred speech. Pt does endorse a slight headache. Pt endorses nausea and received 4mg  of zofran in the field, along with 324 of ASA. Pt is A&Ox4 during triage.

## 2024-02-17 NOTE — Progress Notes (Signed)
°   02/17/24 1851  Spiritual Encounters  Type of Visit Initial  Care provided to: Pt and family  Conversation partners present during encounter Nurse  Referral source Code page  Reason for visit Code  OnCall Visit Yes  Interventions  Spiritual Care Interventions Made Established relationship of care and support;Compassionate presence;Reflective listening;Encouragement   Responded to Code Stroke.  Provided compassionate presence and support to aunt and to the patient.  Will follow up for continued support when patient moved to medical floor.

## 2024-02-17 NOTE — ED Notes (Signed)
 BP in Left arm: 136/93 (105)  BP in Right Arm: 126/76 (91)

## 2024-02-17 NOTE — Progress Notes (Signed)
 CODE STROKE- PHARMACY COMMUNICATION   Time CODE STROKE called/page received: 1734  Time response to CODE STROKE was made (in person or via phone): In person immediately   Time Stroke Kit retrieved from Pyxis (only if needed):n/a  Name of Provider/Nurse contacted: Dr. Germaine  No past medical history on file. Prior to Admission medications  Medication Sig Start Date End Date Taking? Authorizing Provider  Multiple Vitamin (MULTIVITAMIN) capsule Take 1 capsule by mouth daily.    [provider]  Multiple Vitamins-Minerals (HAIR SKIN & NAILS ADVANCED PO) Take by mouth.    [provider]  drospirenone -ethinyl estradiol  (YAZ) 3-0.02 MG tablet Take 1 tablet by mouth daily. 08/14/15 07/09/16  Jimmy Charlie FERNS, MD    Elsie CHRISTELLA Piety ,PharmD Clinical Pharmacist  02/17/2024  5:37 PM

## 2024-02-17 NOTE — ED Provider Notes (Signed)
 [facility] Provider Note   Event Date/Time   First MD Initiated Contact with Patient 02/17/24 1735     (approximate) History  Chest Pain and Extremity Weakness  HPI Diane Suarez is a 38 y.o. female  who denies any past medical history presents complaining of headache, chest pain, left arm numbness, and weakness that began at approximately 1530 today.  Patient states that she had been unable to get her arm off of the wheelchair as she was wheeled in.  Patient also endorses chest pain in the lower sternal region that does not radiate.  Patient also endorses nausea and was given Zofran in the field along with 324 of aspirin ROS: Patient currently denies any vision changes, tinnitus, difficulty speaking, facial droop, sore throat, shortness of breath, abdominal pain, diarrhea, dysuria   Physical Exam  Triage Vital Signs: ED Triage Vitals  Encounter Vitals Group     BP 02/17/24 1727 123/85     Girls Systolic BP Percentile --      Girls Diastolic BP Percentile --      Boys Systolic BP Percentile --      Boys Diastolic BP Percentile --      Pulse Rate 02/17/24 1727 84     Resp 02/17/24 1727 17     Temp 02/17/24 1727 98.6 F (37 C)     Temp Source 02/17/24 1727 Oral     SpO2 02/17/24 1726 100 %     Weight 02/17/24 1801 135 lb (61.2 kg)     Height 02/17/24 1801 5' 5 (1.651 m)     Head Circumference --      Peak Flow --      Pain Score --      Pain Loc --      Pain Education --      Exclude from Growth Chart --    Most recent vital signs: Vitals:   02/17/24 1839 02/17/24 2016  BP: (!) 136/93 124/77  Pulse:  95  Resp:  16  Temp:  98.1 F (36.7 C)  SpO2:  100%   General: Awake, oriented x4. CV:  Good peripheral perfusion. Resp:  Normal effort. Abd:  No distention. Other:  Middle-age well-developed, well-nourished African-American female resting comfortably in no acute distress.  Subjective numbness to left upper extremity.  No drift on exam  however patient states that her left arm feels heavy ED Results / Procedures / Treatments  Labs (all labs ordered are listed, but only abnormal results are displayed) Labs Reviewed  DIFFERENTIAL - Abnormal; Notable for the following components:      Result Value   Neutro Abs 8.0 (*)    All other components within normal limits  COMPREHENSIVE METABOLIC PANEL WITH GFR - Abnormal; Notable for the following components:   Potassium 3.4 (*)    Glucose, Bld 107 (*)    Alkaline Phosphatase 32 (*)    All other components within normal limits  PROTIME-INR  APTT  CBC  ETHANOL  URINE DRUG SCREEN  CBG MONITORING, ED  CBG MONITORING, ED  I-STAT CREATININE, ED  POC URINE PREG, ED  TROPONIN T, HIGH SENSITIVITY  TROPONIN T, HIGH SENSITIVITY   EKG ED ECG REPORT I, Artist MARLA Kerns, the attending physician, personally viewed and interpreted this ECG. Date: 02/17/2024 EKG Time: 1721 Rate: 80 Rhythm: normal sinus rhythm QRS Axis: normal Intervals: normal ST/T Wave abnormalities: normal Narrative Interpretation: no evidence of acute ischemia RADIOLOGY ED MD interpretation: CT without contrast of the  head shows no acute intracranial hemorrhage and incidentally found subtle asymmetric hypoattenuation in the inferior left temporal lobe favored to be secondary to a streak artifact  CTA of the head and neck shows no large vessel occlusion or hemodynamically significant stenosis MRI of the brain without contrast shows no acute intracranial abnormality - All radiology independently interpreted and agree with radiology assessment Official radiology report(s): MR BRAIN WO CONTRAST Result Date: 02/17/2024 EXAM: MRI Brain Without Contrast 02/17/2024 09:15:39 PM TECHNIQUE: Multiplanar multisequence MRI of the head/brain was performed without the administration of intravenous contrast. COMPARISON: CT head earlier today. CLINICAL HISTORY: Neuro deficit, acute, stroke suspected FINDINGS: BRAIN AND VENTRICLES:  No acute infarct. No intracranial hemorrhage. No mass. No midline shift. No hydrocephalus. Mildly advanced for age small T2/FLAIR hyperintensities in the white matter. Normal flow voids. ORBITS: No acute abnormality. SINUSES AND MASTOIDS: No acute abnormality. BONES AND SOFT TISSUES: Normal marrow signal. No acute soft tissue abnormality. IMPRESSION: 1. No acute intracranial abnormality. 2. Mildly advanced for age small T2/FLAIR hyperintensities in the white matter. These are nonspecific, but most likely secondary to chronic microvascular ischemic change. Additional less likely considerations include chronic demyelination or sequelae of migraines. Electronically signed by: Gilmore Molt 02/17/2024 09:29 PM EST RP Workstation: [id number]   CT ANGIO HEAD NECK W WO CM Result Date: 02/17/2024 EXAM: CTA HEAD AND NECK WITHOUT AND WITH 02/17/2024 05:54:15 PM TECHNIQUE: CTA of the head and neck was performed without and with the administration of 75 mL of iohexol (OMNIPAQUE) 350 MG/ML injection. Multiplanar 2D and/or 3D reformatted images are provided for review. Automated exposure control, iterative reconstruction, and/or weight based adjustment of the mA/kV was utilized to reduce the radiation dose to as low as reasonably achievable. Stenosis of the internal carotid arteries measured using NASCET criteria. COMPARISON: None available CLINICAL HISTORY: Neuro deficit, acute, stroke suspected. FINDINGS: CTA NECK: AORTIC ARCH AND ARCH VESSELS: No dissection or arterial injury. No significant stenosis of the brachiocephalic or subclavian arteries. CERVICAL CAROTID ARTERIES: No dissection, arterial injury, or hemodynamically significant stenosis by NASCET criteria. CERVICAL VERTEBRAL ARTERIES: No dissection, arterial injury, or significant stenosis. LUNGS AND MEDIASTINUM: Unremarkable. SOFT TISSUES: No acute abnormality. BONES: Indeterminate sclerotic focus in the thoracic spine within the left inferior aspect of the T4  vertebral body. Consider nonemergent MRI of the thoracic spine with and without contrast for further evaluation. CTA HEAD: ANTERIOR CIRCULATION: There is a 1.6 mm inferiorly directed outpouching along the right supraclinoid ICA favored to reflect an infundibulum at the origin of the right posterior communicating artery less likely a small aneurysm. No significant stenosis of the internal carotid arteries. No significant stenosis of the anterior cerebral arteries. No significant stenosis of the middle cerebral arteries. POSTERIOR CIRCULATION: No significant stenosis of the posterior cerebral arteries. No significant stenosis of the basilar artery. No significant stenosis of the vertebral arteries. No aneurysm. OTHER: No dural venous sinus thrombosis on this non-dedicated study. IMPRESSION: 1. No large vessel occlusion or hemodynamically significant stenosis in the head or neck. 2. 1.6 mm inferiorly directed outpouching along the right supraclinoid ICA is most consistent with an infundibulum at the right posterior communicating artery origin, a small aneurysm is less likely. 3. Indeterminate sclerotic focus at T4. Consider nonemergent MRI of the thoracic spine with and without contrast for further evaluation. Electronically signed by: Donnice Mania MD 02/17/2024 07:02 PM EST RP Workstation: [id number]   CT HEAD CODE STROKE WO CONTRAST Result Date: 02/17/2024 EXAM: CT HEAD WITHOUT CONTRAST 02/17/2024 05:37:32 PM TECHNIQUE: CT of  the head was performed without the administration of intravenous contrast. Automated exposure control, iterative reconstruction, and/or weight based adjustment of the mA/kV was utilized to reduce the radiation dose to as low as reasonably achievable. COMPARISON: None available. CLINICAL HISTORY: Neuro deficit, acute, stroke suspected. FINDINGS: BRAIN AND VENTRICLES: No acute intracranial hemorrhage. There is subtle asymmetric hypoattenuation in the inferior aspect at the left temporal lobe  which is likely secondary to streak artifact. Gray white matter differentiation is otherwise maintained. No evidence of acute infarct. No hydrocephalus. No extra-axial collection. No mass effect or midline shift. [place] Stroke Program Early CT (ASPECT) Score: Ganglionic (caudate, IC, lentiform nucleus, insula, M1-M3): 7 Supraganglionic (M4-M6): 3 Total: 10 ORBITS: No acute abnormality. SINUSES: No acute abnormality. SOFT TISSUES AND SKULL: No acute soft tissue abnormality. No skull fracture. IMPRESSION: 1. No acute intracranial hemorrhage. 2. Subtle asymmetric hypoattenuation in the inferior left temporal lobe favored secondary to streak artifact. 3. ASPECT score is 10. 4. Findings discussed with Dr. Jossie at 5:53 PM on 02/17/24. Electronically signed by: Donnice Mania MD 02/17/2024 05:55 PM EST RP Workstation: [id number]   PROCEDURES: Critical Care performed: No Procedures MEDICATIONS ORDERED IN ED: Medications  sodium chloride flush (NS) 0.9 % injection 3 mL (3 mLs Intravenous Given 02/17/24 1802)  iohexol (OMNIPAQUE) 350 MG/ML injection 75 mL (75 mLs Intravenous Contrast Given 02/17/24 1747)  clopidogrel (PLAVIX) tablet 300 mg (300 mg Oral Given 02/17/24 1856)  ketorolac (TORADOL) 30 MG/ML injection 30 mg (30 mg Intravenous Given 02/17/24 1858)  metoCLOPramide (REGLAN) injection 10 mg (10 mg Intravenous Given 02/17/24 1858)  diphenhydrAMINE (BENADRYL) injection 25 mg (25 mg Intravenous Given 02/17/24 1859)  magnesium sulfate IVPB 2 g 50 mL (0 g Intravenous Stopped 02/17/24 2002)  sodium chloride 0.9 % bolus 1,000 mL (1,000 mLs Intravenous New Bag/Given 02/17/24 1903)  ondansetron (ZOFRAN) injection 4 mg (4 mg Intravenous Given 02/17/24 2204)   IMPRESSION / MDM / ASSESSMENT AND PLAN / ED COURSE  I reviewed the triage vital signs and the nursing notes.                             The patient is on the cardiac monitor to evaluate for evidence of arrhythmia and/or significant heart rate  changes. Patient's presentation is most consistent with acute presentation with potential threat to life or bodily function. Patient is a 38 year old female with the above-stated past medical history presents complaining of headache, chest pain, nausea/vomiting, and weakness/numbness in the left extremities DDx: CVA, migraine with aura, upper respiratory infection, encephalitis, meningitis Plan: Stroke alert called from triage  Based on the physical exam, laboratory evaluation, radiologic evaluation, patient does not meet criteria for thrombolytics at this time.  Patient is exam somewhat complicated by participation from the patient as she does not have any drift however states that she cannot pick her arm up off of the wheelchair.  Patient had negative CTA and MRI and therefore I am more concerned for possible migraine with aura.  Patient received migraine cocktail with adequate resolution of her headache and neurologic symptoms.  Patient states that she is still mildly nauseous and was given extra dose of Zofran before patient was p.o. tolerant.  Patient was encouraged to follow-up with outpatient neurology and given referral as well as follow-up information for Dr. Lane.  Patient given strict return precautions and all questions answered prior to discharge  Dispo: Discharge home with neurology follow-up   FINAL CLINICAL IMPRESSION(S) / ED  DIAGNOSES   Final diagnoses:  Other migraine without status migrainosus, not intractable  Left arm numbness  Left arm weakness   Rx / DC Orders   ED Discharge Orders          Ordered    butalbital-acetaminophen -caffeine (FIORICET) 50-325-40 MG tablet  Every 6 hours PRN        02/17/24 2225    ondansetron (ZOFRAN-ODT) 8 MG disintegrating tablet  Every 8 hours PRN        02/17/24 2225           Note:  This document was prepared using Dragon voice recognition software and may include unintentional dictation errors.   Latima Hamza K, MD 02/17/24  [phone]

## 2024-02-17 NOTE — Telephone Encounter (Signed)
 FYI Only or Action Required?: FYI only for provider: ED advised.- 911 called  Patient was last seen in primary care on 09/08/2023 by Jimmy Charlie FERNS, MD.  Called Nurse Triage reporting Dizziness.  Symptoms began today.  Interventions attempted: Rest, hydration, or home remedies.  Symptoms are: gradually worsening.  Triage Disposition: Call EMS 911 Now  Patient/caregiver understands and will follow disposition?: Yes    pied from CRM #[id number]. Topic: Clinical - Red Word Triage >> Feb 17, 2024  4:00 PM Thersia C wrote: [facility] that prompted transfer to Nurse Triage: Blood pressure is higher than normal , nausesa and sick and alil light headed, get chills as well Reason for Disposition  [1] Chest pain lasts > 5 minutes AND [2] age > 30 AND [3] one or more cardiac risk factors (e.g., diabetes, high blood pressure, high cholesterol, obesity with BMI 30 or higher, smoker, or strong family history of heart disease)  Answer Assessment - Initial Assessment Questions Pt called requesting appt for blood work. She states about 330 her BP was higher than normal for her, she was dizzy, nauseated, chest discomfort and left arm tingling. Pt states it happened suddenly about 330. Same thing happened last month but after laying down and drinking water she felt better but this time also has the left arm tingling.  Pt states she is under a lot of stress, she cares for her mom who had dementia, 2 children, works 2 jobs and is going to school. Recommendations are for patient to go to the ER. Rn asked if she had someone to drive her, she states yes and then got quiet, Rn called name and she sounded more quiet. RN asked if she was ok, she said yes, then Rn asked if she felt like she was going to pass out, she said yes. Another RN called 911. While on phone with patient she 3 way called her aunt, Rn explained the situation and she was going to go over to sit with mother and children.  RN stayed on phone until  EMS/Fire arrived.    1. LOCATION: Where does it hurt?       Unknown. 2. RADIATION: Does the pain go anywhere else? (e.g., into neck, jaw, arms, back)     Left arm tingling 3. ONSET: When did the chest pain begin? (Minutes, hours or days)      About 30 minutes prior to calling 4. PATTERN: Does the pain come and go, or has it been constant since it started?  Does it get worse with exertion?      constant 5. DURATION: How long does it last (e.g., seconds, minutes, hours)     So far about an hour 6. SEVERITY: How bad is the pain?  (e.g., Scale 1-10; mild, moderate, or severe)     Rn did not ask 7. CARDIAC RISK FACTORS: Do you have any history of heart problems or risk factors for heart disease? (e.g., angina, prior heart attack; diabetes, high blood pressure, high cholesterol, smoker, or strong family history of heart disease)     Family hx of heart dz 8. PULMONARY RISK FACTORS: Do you have any history of lung disease?  (e.g., blood clots in lung, asthma, emphysema, birth control pills)     no 9. CAUSE: What do you think is causing the chest pain?     unknown 10. OTHER SYMPTOMS: Do you have any other symptoms? (e.g., dizziness, nausea, vomiting, sweating, fever, difficulty breathing, cough)  Dizziness, nausea,  denies other symptoms  Protocols used: Chest Pain-A-AH

## 2024-02-24 ENCOUNTER — Ambulatory Visit: Admitting: Family Medicine

## 2024-02-24 ENCOUNTER — Encounter: Payer: Self-pay | Admitting: Family Medicine

## 2024-02-24 VITALS — BP 94/64 | HR 65 | Temp 97.8°F | Ht 65.0 in | Wt 116.5 lb

## 2024-02-24 DIAGNOSIS — G43009 Migraine without aura, not intractable, without status migrainosus: Secondary | ICD-10-CM | POA: Diagnosis not present

## 2024-02-24 DIAGNOSIS — R531 Weakness: Secondary | ICD-10-CM | POA: Diagnosis not present

## 2024-02-24 DIAGNOSIS — R200 Anesthesia of skin: Secondary | ICD-10-CM | POA: Insufficient documentation

## 2024-02-24 DIAGNOSIS — R29898 Other symptoms and signs involving the musculoskeletal system: Secondary | ICD-10-CM | POA: Insufficient documentation

## 2024-02-24 NOTE — Progress Notes (Signed)
 Patient ID: Diane Suarez, female    DOB: 1985-11-26, 38 y.o.   MRN: [id number]  This visit was conducted in person.  BP 94/64   Pulse 65   Temp 97.8 F (36.6 C) (Temporal)   Ht 5' 5 (1.651 m)   Wt 116 lb 8 oz (52.8 kg)   LMP 02/08/2024   SpO2 96%   BMI 19.39 kg/m    CC:  Chief Complaint  Patient presents with   Follow-up    ER follow up from 12/112025    Subjective:   HPI: Diane Suarez is a 38 y.o. female presenting on 02/24/2024 for Follow-up (ER follow up from 12/112025)  PCP .[facility] TOC with [facility] in 09/2023  ER  visit  02/17/24 seen for chest pain, arm weakness, arm numbness nausea.. had awoken for a nap  EKG unremarkable Complete metabolic panel showed potassium 3.4, glucose 107, otherwise normal  No anemia, normal white blood cell count CT head unremarkable CT neck head angio unremarkable except incidental findings of: 1.6 mm inferiorly directed outpouching along the right supraclinoid ICA is most consistent with an infundibulum at the right posterior communicating artery origin, a small aneurysm is less likely. 3. Indeterminate sclerotic focus at T4. Consider nonemergent MRI of the thoracic spine with and without contrast for further evaluation.  MRI brain: IMPRESSION: 1. No acute intracranial abnormality. 2. Mildly advanced for age small T2/FLAIR hyperintensities in the white matter. These are nonspecific, but most likely secondary to chronic microvascular ischemic change. Additional less likely considerations include chronic demyelination or sequelae of migraines.  Felt likely due to migraine.. recommended referral to Neuro.. Dr. Lane    No issues since. Did have issue of lightheadedness 1 month ago while cooking.  Resolved after short time with water. No weakness or numbness.  No headache   No history of migraine.   Relevant past medical, surgical, family and social history reviewed and updated as indicated. Interim medical history since our last  visit reviewed. Allergies and medications reviewed and updated. Outpatient Medications Prior to Visit  Medication Sig Dispense Refill   Multiple Vitamin (MULTIVITAMIN) capsule Take 1 capsule by mouth daily.     Multiple Vitamins-Minerals (HAIR SKIN & NAILS ADVANCED PO) Take by mouth.     aspirin 81 MG chewable tablet Chew 81 mg by mouth daily.     polyethylene glycol (MIRALAX / GLYCOLAX) 17 g packet Take 17 g by mouth daily.     butalbital -acetaminophen -caffeine  (FIORICET) 50-325-40 MG tablet Take 1-2 tablets by mouth every 6 (six) hours as needed for headache. (Patient not taking: Reported on 02/24/2024) 20 tablet 0   ondansetron  (ZOFRAN -ODT) 8 MG disintegrating tablet Take 1 tablet (8 mg total) by mouth every 8 (eight) hours as needed for nausea or vomiting. (Patient not taking: Reported on 02/24/2024) 20 tablet 0   No facility-administered medications prior to visit.     Per HPI unless specifically indicated in ROS section below Review of Systems  Constitutional:  Negative for fatigue and fever.  HENT:  Negative for congestion.   Eyes:  Negative for pain.  Respiratory:  Negative for cough and shortness of breath.   Cardiovascular:  Negative for chest pain, palpitations and leg swelling.  Gastrointestinal:  Negative for abdominal pain.  Genitourinary:  Negative for dysuria and vaginal bleeding.  Musculoskeletal:  Negative for back pain.  Neurological:  Negative for syncope, light-headedness and headaches.  Psychiatric/Behavioral:  Negative for dysphoric mood.    Objective:  BP 94/64  Pulse 65   Temp 97.8 F (36.6 C) (Temporal)   Ht 5' 5 (1.651 m)   Wt 116 lb 8 oz (52.8 kg)   LMP 02/08/2024   SpO2 96%   BMI 19.39 kg/m   Wt Readings from Last 3 Encounters:  02/24/24 116 lb 8 oz (52.8 kg)  02/17/24 135 lb (61.2 kg)  09/08/23 118 lb (53.5 kg)      Physical Exam Constitutional:      General: She is not in acute distress.    Appearance: Normal appearance. She is  well-developed. She is not ill-appearing or toxic-appearing.  HENT:     Head: Normocephalic.     Right Ear: Hearing, tympanic membrane, ear canal and external ear normal. Tympanic membrane is not erythematous, retracted or bulging.     Left Ear: Hearing, tympanic membrane, ear canal and external ear normal. Tympanic membrane is not erythematous, retracted or bulging.     Nose: No mucosal edema or rhinorrhea.     Right Sinus: No maxillary sinus tenderness or frontal sinus tenderness.     Left Sinus: No maxillary sinus tenderness or frontal sinus tenderness.     Mouth/Throat:     Pharynx: Uvula midline.  Eyes:     General: Lids are normal. Lids are everted, no foreign bodies appreciated.     Conjunctiva/sclera: Conjunctivae normal.     Pupils: Pupils are equal, round, and reactive to light.  Neck:     Thyroid: No thyroid mass or thyromegaly.     Vascular: No carotid bruit.     Trachea: Trachea normal.  Cardiovascular:     Rate and Rhythm: Normal rate and regular rhythm.     Pulses: Normal pulses.     Heart sounds: Normal heart sounds, S1 normal and S2 normal. No murmur heard.    No friction rub. No gallop.  Pulmonary:     Effort: Pulmonary effort is normal. No tachypnea or respiratory distress.     Breath sounds: Normal breath sounds. No decreased breath sounds, wheezing, rhonchi or rales.  Abdominal:     General: Bowel sounds are normal.     Palpations: Abdomen is soft.     Tenderness: There is no abdominal tenderness.  Musculoskeletal:     Cervical back: Normal range of motion and neck supple.  Skin:    General: Skin is warm and dry.     Findings: No rash.  Neurological:     Mental Status: She is alert and oriented to person, place, and time.     GCS: GCS eye subscore is 4. GCS verbal subscore is 5. GCS motor subscore is 6.     Cranial Nerves: No cranial nerve deficit.     Sensory: No sensory deficit.     Motor: No abnormal muscle tone.     Coordination: Coordination normal.      Gait: Gait normal.     Deep Tendon Reflexes: Reflexes are normal and symmetric.     Comments: Nml cerebellar exam   No papilledema  Psychiatric:        Mood and Affect: Mood is not anxious or depressed.        Speech: Speech normal.        Behavior: Behavior normal. Behavior is cooperative.        Thought Content: Thought content normal.        Cognition and Memory: Memory is not impaired. She does not exhibit impaired recent memory or impaired remote memory.  Judgment: Judgment normal.       Results for orders placed or performed during the hospital encounter of 02/17/24  CBG monitoring, ED   Collection Time: 02/17/24  5:27 PM  Result Value Ref Range   Glucose-Capillary 96 70 - 99 mg/dL  Protime-INR   Collection Time: 02/17/24  5:32 PM  Result Value Ref Range   Prothrombin Time 13.9 11.4 - 15.2 seconds   INR 1.0 0.8 - 1.2  APTT   Collection Time: 02/17/24  5:32 PM  Result Value Ref Range   aPTT 28 24 - 36 seconds  CBC   Collection Time: 02/17/24  5:32 PM  Result Value Ref Range   WBC 9.6 4.0 - 10.5 K/uL   RBC 4.40 3.87 - 5.11 MIL/uL   Hemoglobin 12.6 12.0 - 15.0 g/dL   HCT 61.0 63.9 - 53.9 %   MCV 88.4 80.0 - 100.0 fL   MCH 28.6 26.0 - 34.0 pg   MCHC 32.4 30.0 - 36.0 g/dL   RDW 87.1 88.4 - 84.4 %   Platelets 251 150 - 400 K/uL   nRBC 0.0 0.0 - 0.2 %  Differential   Collection Time: 02/17/24  5:32 PM  Result Value Ref Range   Neutrophils Relative % 84 %   Neutro Abs 8.0 (H) 1.7 - 7.7 K/uL   Lymphocytes Relative 11 %   Lymphs Abs 1.1 0.7 - 4.0 K/uL   Monocytes Relative 5 %   Monocytes Absolute 0.5 0.1 - 1.0 K/uL   Eosinophils Relative 0 %   Eosinophils Absolute 0.0 0.0 - 0.5 K/uL   Basophils Relative 0 %   Basophils Absolute 0.0 0.0 - 0.1 K/uL   Immature Granulocytes 0 %   Abs Immature Granulocytes 0.02 0.00 - 0.07 K/uL  Comprehensive metabolic panel   Collection Time: 02/17/24  5:32 PM  Result Value Ref Range   Sodium 139 135 - 145 mmol/L    Potassium 3.4 (L) 3.5 - 5.1 mmol/L   Chloride 101 98 - 111 mmol/L   CO2 25 22 - 32 mmol/L   Glucose, Bld 107 (H) 70 - 99 mg/dL   BUN 11 6 - 20 mg/dL   Creatinine, Ser 9.34 0.44 - 1.00 mg/dL   Calcium 8.9 8.9 - 89.6 mg/dL   Total Protein 7.4 6.5 - 8.1 g/dL   Albumin 4.5 3.5 - 5.0 g/dL   AST 17 15 - 41 U/L   ALT 11 0 - 44 U/L   Alkaline Phosphatase 32 (L) 38 - 126 U/L   Total Bilirubin 0.6 0.0 - 1.2 mg/dL   GFR, Estimated >39 >39 mL/min   Anion gap 13 5 - 15  Ethanol   Collection Time: 02/17/24  5:32 PM  Result Value Ref Range   Alcohol, Ethyl (B) <15 <15 mg/dL  Troponin T, High Sensitivity   Collection Time: 02/17/24  5:32 PM  Result Value Ref Range   Troponin T High Sensitivity <15 0 - 19 ng/L  Troponin T, High Sensitivity   Collection Time: 02/17/24  8:42 PM  Result Value Ref Range   Troponin T High Sensitivity <15 0 - 19 ng/L    Assessment and Plan  Acute left-sided weakness -     Ambulatory referral to Neurology  Atypical migraine -     Ambulatory referral to Neurology   Patient without additional symptoms.  May have been atypical migraine.  Evaluation unremarkable except for small infundibulum as well as increased signal on MRI brain possibly associated with migraine history.  Recommended she follow-up with neurology given atypical nature of symptoms.  Referral placed. Recommended healthy lifestyle, rest and migraine trigger avoidance.  Recommend adequate sleep at night.  Return and ER precautions provided. No follow-ups on file.   Greig Ring, MD

## 2024-06-21 ENCOUNTER — Ambulatory Visit: Admitting: Neurology

## 2024-09-11 ENCOUNTER — Encounter
